# Patient Record
Sex: Female | Born: 1975 | Race: White | Hispanic: No | Marital: Married | Smoking: Former smoker
Health system: Southern US, Community
[De-identification: ages and names within clinical notes are randomized; demographics above are authoritative.]

## PROBLEM LIST (undated history)

## (undated) DIAGNOSIS — R636 Underweight: Secondary | ICD-10-CM

## (undated) HISTORY — DX: Underweight: R63.6

---

## 2014-01-05 LAB — HM PAP SMEAR: HM Pap smear: NEGATIVE

## 2014-12-15 ENCOUNTER — Emergency Department (HOSPITAL_COMMUNITY): Payer: BLUE CROSS/BLUE SHIELD

## 2014-12-15 ENCOUNTER — Encounter (HOSPITAL_COMMUNITY): Payer: Self-pay | Admitting: *Deleted

## 2014-12-15 ENCOUNTER — Inpatient Hospital Stay (HOSPITAL_COMMUNITY)
Admission: EM | Admit: 2014-12-15 | Discharge: 2014-12-19 | DRG: 387 | Disposition: A | Discharge status: Home / Self Care | Payer: BLUE CROSS/BLUE SHIELD | Attending: Infectious Diseases | Admitting: Infectious Diseases

## 2014-12-15 DIAGNOSIS — G4700 Insomnia, unspecified: Secondary | ICD-10-CM | POA: Diagnosis present

## 2014-12-15 DIAGNOSIS — F3281 Premenstrual dysphoric disorder: Secondary | ICD-10-CM | POA: Diagnosis present

## 2014-12-15 DIAGNOSIS — R197 Diarrhea, unspecified: Secondary | ICD-10-CM | POA: Diagnosis present

## 2014-12-15 DIAGNOSIS — F419 Anxiety disorder, unspecified: Secondary | ICD-10-CM | POA: Diagnosis present

## 2014-12-15 DIAGNOSIS — Z8719 Personal history of other diseases of the digestive system: Secondary | ICD-10-CM

## 2014-12-15 DIAGNOSIS — R1110 Vomiting, unspecified: Secondary | ICD-10-CM | POA: Insufficient documentation

## 2014-12-15 DIAGNOSIS — R112 Nausea with vomiting, unspecified: Secondary | ICD-10-CM

## 2014-12-15 DIAGNOSIS — K529 Noninfective gastroenteritis and colitis, unspecified: Secondary | ICD-10-CM

## 2014-12-15 DIAGNOSIS — E162 Hypoglycemia, unspecified: Secondary | ICD-10-CM | POA: Diagnosis not present

## 2014-12-15 DIAGNOSIS — K5100 Ulcerative (chronic) pancolitis without complications: Principal | ICD-10-CM | POA: Diagnosis present

## 2014-12-15 DIAGNOSIS — F17210 Nicotine dependence, cigarettes, uncomplicated: Secondary | ICD-10-CM | POA: Diagnosis present

## 2014-12-15 HISTORY — DX: Personal history of other diseases of the digestive system: Z87.19

## 2014-12-15 LAB — URINE MICROSCOPIC-ADD ON

## 2014-12-15 LAB — URINALYSIS, ROUTINE W REFLEX MICROSCOPIC
BILIRUBIN URINE: NEGATIVE
GLUCOSE, UA: NEGATIVE mg/dL
Ketones, ur: >80 mg/dL — AB
Leukocytes, UA: NEGATIVE
Nitrite: NEGATIVE
PROTEIN: NEGATIVE mg/dL
SPECIFIC GRAVITY, URINE: 1.012 (ref 1.005–1.030)
UROBILINOGEN UA: 0.2 mg/dL (ref 0.0–1.0)
pH: 6.5 (ref 5.0–8.0)

## 2014-12-15 LAB — CBC
HCT: 42.1 % (ref 36.0–46.0)
Hemoglobin: 14.2 g/dL (ref 12.0–15.0)
MCH: 30.1 pg (ref 26.0–34.0)
MCHC: 33.7 g/dL (ref 30.0–36.0)
MCV: 89.2 fL (ref 78.0–100.0)
Platelets: 176 10*3/uL (ref 150–400)
RBC: 4.72 MIL/uL (ref 3.87–5.11)
RDW: 13.3 % (ref 11.5–15.5)
WBC: 10.3 10*3/uL (ref 4.0–10.5)

## 2014-12-15 LAB — COMPREHENSIVE METABOLIC PANEL
ALBUMIN: 3.9 g/dL (ref 3.5–5.0)
ALK PHOS: 69 U/L (ref 38–126)
ALT: 20 U/L (ref 14–54)
AST: 25 U/L (ref 15–41)
Anion gap: 8 (ref 5–15)
BILIRUBIN TOTAL: 0.4 mg/dL (ref 0.3–1.2)
BUN: 9 mg/dL (ref 6–20)
CALCIUM: 9.1 mg/dL (ref 8.9–10.3)
CO2: 27 mmol/L (ref 22–32)
Chloride: 101 mmol/L (ref 101–111)
Creatinine, Ser: 0.78 mg/dL (ref 0.44–1.00)
GFR calc Af Amer: >60 mL/min (ref >60)
GFR calc non Af Amer: >60 mL/min (ref >60)
GLUCOSE: 76 mg/dL (ref 65–99)
Potassium: 3.9 mmol/L (ref 3.5–5.1)
Sodium: 136 mmol/L (ref 135–145)
TOTAL PROTEIN: 6.7 g/dL (ref 6.5–8.1)

## 2014-12-15 LAB — I-STAT BETA HCG BLOOD, ED (MC, WL, AP ONLY): I-stat hCG, quantitative: <5 m[IU]/mL (ref <5)

## 2014-12-15 LAB — LIPASE, BLOOD: Lipase: 20 U/L (ref 11–51)

## 2014-12-15 MED ORDER — IOHEXOL 300 MG/ML  SOLN
25.0000 mL | Freq: Once | INTRAMUSCULAR | Status: AC | PRN
  Administered 2014-12-15: 25 mL via ORAL

## 2014-12-15 MED ORDER — ONDANSETRON HCL 4 MG/2ML IJ SOLN
4.0000 mg | Freq: Four times a day (QID) | INTRAMUSCULAR | Status: DC | PRN
  Administered 2014-12-15 – 2014-12-18 (×6): 4 mg via INTRAVENOUS
  Filled 2014-12-15 (×6): qty 2

## 2014-12-15 MED ORDER — ALUM & MAG HYDROXIDE-SIMETH 200-200-20 MG/5ML PO SUSP
30.0000 mL | Freq: Four times a day (QID) | ORAL | Status: DC | PRN
  Administered 2014-12-15 – 2014-12-16 (×2): 30 mL via ORAL
  Filled 2014-12-15 (×2): qty 30

## 2014-12-15 MED ORDER — MORPHINE SULFATE (PF) 4 MG/ML IV SOLN
4.0000 mg | Freq: Once | INTRAVENOUS | Status: AC
  Administered 2014-12-15: 4 mg via INTRAVENOUS
  Filled 2014-12-15: qty 1

## 2014-12-15 MED ORDER — ACETAMINOPHEN 325 MG PO TABS
650.0000 mg | ORAL_TABLET | Freq: Four times a day (QID) | ORAL | Status: DC | PRN
  Administered 2014-12-15 – 2014-12-16 (×2): 650 mg via ORAL
  Filled 2014-12-15 (×2): qty 2

## 2014-12-15 MED ORDER — ENOXAPARIN SODIUM 40 MG/0.4ML ~~LOC~~ SOLN
40.0000 mg | SUBCUTANEOUS | Status: DC
  Filled 2014-12-15 (×3): qty 0.4

## 2014-12-15 MED ORDER — ONDANSETRON HCL 4 MG/2ML IJ SOLN
4.0000 mg | Freq: Once | INTRAMUSCULAR | Status: AC
  Administered 2014-12-15: 4 mg via INTRAVENOUS
  Filled 2014-12-15: qty 2

## 2014-12-15 MED ORDER — ONDANSETRON 4 MG PO TBDP
4.0000 mg | ORAL_TABLET | Freq: Once | ORAL | Status: AC | PRN
  Administered 2014-12-15: 4 mg via ORAL

## 2014-12-15 MED ORDER — TEMAZEPAM 7.5 MG PO CAPS
7.5000 mg | ORAL_CAPSULE | Freq: Every evening | ORAL | Status: DC | PRN
  Administered 2014-12-15: 7.5 mg via ORAL
  Filled 2014-12-15: qty 1

## 2014-12-15 MED ORDER — ONDANSETRON HCL 4 MG/2ML IJ SOLN: 4.0000 mg | Freq: Three times a day (TID) | INTRAMUSCULAR | Status: DC | PRN

## 2014-12-15 MED ORDER — SODIUM CHLORIDE 0.9 % IV SOLN
INTRAVENOUS | Status: AC
  Administered 2014-12-15: 21:00:00 via INTRAVENOUS

## 2014-12-15 MED ORDER — SODIUM CHLORIDE 0.9 % IV BOLUS (SEPSIS)
1000.0000 mL | Freq: Once | INTRAVENOUS | Status: AC
  Administered 2014-12-15: 1000 mL via INTRAVENOUS

## 2014-12-15 MED ORDER — ONDANSETRON 4 MG PO TBDP
ORAL_TABLET | ORAL | Status: AC
  Filled 2014-12-15: qty 1

## 2014-12-15 MED ORDER — IOHEXOL 300 MG/ML  SOLN
100.0000 mL | Freq: Once | INTRAMUSCULAR | Status: AC | PRN
  Administered 2014-12-15: 100 mL via INTRAVENOUS

## 2014-12-15 MED ORDER — KETOROLAC TROMETHAMINE 15 MG/ML IJ SOLN
15.0000 mg | Freq: Once | INTRAMUSCULAR | Status: AC
  Administered 2014-12-15: 15 mg via INTRAVENOUS
  Filled 2014-12-15: qty 1

## 2014-12-15 MED ORDER — CIPROFLOXACIN HCL 500 MG PO TABS
500.0000 mg | ORAL_TABLET | Freq: Two times a day (BID) | ORAL | Status: DC
  Administered 2014-12-15 – 2014-12-16 (×3): 500 mg via ORAL
  Filled 2014-12-15 (×4): qty 1

## 2014-12-15 MED ORDER — METRONIDAZOLE 500 MG PO TABS
500.0000 mg | ORAL_TABLET | Freq: Two times a day (BID) | ORAL | Status: DC
  Administered 2014-12-15 – 2014-12-16 (×3): 500 mg via ORAL
  Filled 2014-12-15 (×3): qty 1

## 2014-12-15 MED ORDER — ONDANSETRON HCL 4 MG PO TABS: 4.0000 mg | ORAL_TABLET | Freq: Four times a day (QID) | ORAL | Status: DC | PRN

## 2014-12-15 NOTE — ED Provider Notes (Signed)
This [facility] care [facility] at shift change. Please see his note for further. Patient presented with nausea, vomiting and diarrhea for the past 3 days with copious amounts of diarrhea. Patient tells me she is still having diarrhea in the emergency department and has had blood in her stool. In my evaluation the patient reports her abdominal pain is down to 2 out of 10 and her nausea is much improved. She is wanting to try water to drink.  The patient's CBC, CMP and lipase are within normal limits. She has a negative isotope hCG. Urinalysis indicated nitrite and leukocyte negative urine with many bacteria and urine microscopic exam. Urine was sent for culture. At shift change the patient is awaiting CT scan. CT scan indicated diffuse colonic wall thickening, most probably involving the hepatic flexure, suggesting infectious/inflammatory pancolitis. Patient has generalized abdominal tenderness to palpation without focal tenderness.  We will provide with second fluid bolus, Zofran and some morphine and do by mouth trial with water.   Patient was discussed with and evaluated by Dr. Rennis ChrisJacobowitz who feels the patient needs to be admitted. I agree with this plan. I consulted for admission with internal medicine team and was the patient was accepted for admission with attending physician Dr. Ninetta LightsHatcher. Med surg orders placed.   This patient was discussed with and evaluated by Dr. Rennis ChrisJacobowitz agrees with assessment and plan.  Everlene FarrierWilliam Faizah Kandler, PA-C 12/16/14 0104  Doug SouSam Jacubowitz, MD 12/16/14 [id number]

## 2014-12-15 NOTE — H&P (Signed)
Date: 12/15/2014               Patient Name:  Victoria Ashley MRN: [id number]  DOB: October 07, 1975 Age / Sex: 39 y.o., female   PCP: Lindley Magnus, PA-C         Medical Service: Internal Medicine Teaching Service         Attending Physician: Dr. Ginnie Smart, MD    First Contact: Dr. Gwynn Burly, DO Pager: [phone]  Second Contact: Dr. Rich Number, MD Pager: [phone]       After Hours (After 5p/  First Contact Pager: [phone]  weekends / holidays): Second Contact Pager: [phone]   Chief Complaint: Diarrhea  History of Present Illness:   Victoria Ashley is a 39 year old woman with no obviously contributory past medical history who presents with nausea, vomiting, and diarrhea. On the morning of 10/8, she had watery, occasionally bloody, diarrhea for about four hours that eventually resolved later in the day. Her symptoms returned on 10/9, this time associated with nausea and non-bloody, non-bilious vomiting. She has had very low appetite and has been unable to keep anything down. Victoria Ashley hasn't taken her temperature, but she has had chills and endorses some numbness and tingling in her extremities and presyncopal symptoms. She has had lower abdominal pain as well. She attempted taking Pepto-Bismol, but it was unhelpful. She reports going on a hike last week, but said that she did not drink any water. She denies any sick contacts, having children in daycare, recent antibiotic use, or travel. She reports that this is first time this has happened to her, cannot recall any family members with similar symptoms, and cannot identify any family members with autoimmune disorders. Otherwise, she denies any headache, chest pain, shortness of breath, polyuria, dysuria, vaginal discharge, or unintended weight loss. She was seen in at [facility] in [place] on 11/10 for these symptoms and was advised to go to the ED to assess for dehydration and electrolyte imbalances.  Her other medical  issues are insomnia for which she takes temazepam, anxiety for which she take fluoxetine (hasn't taken in 3 days), and premenstrual dysphoria for which she was planning to take progesterone (has not started yet). She also has a history of infertility and sought donor oocytes in the past. She is premenopausal and has not had a period in 8 months. She has a 5 pack-year history, currently smoking 1/4 ppd. Does not use drugs or alcohol. She is only sexually active with her husband. 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   In the ED, she was afebrile without a leukocytosis. There were no electrolyte abnormalities or evidence of an AKI. A UA did not suggest a UTI. A pregnancy test was negative. Lipase was normal. A CT of the abdomen and pelvis with contrast demonstrated diffuse colonic wall thickening suggestive of an infectious/inflammatory pancolitis with a small volume of pelvic ascites. A stool culture was obtained. She received 1L NS in the ED.  Meds: Current Facility-Administered Medications  Medication Dose Route Frequency Provider Last Rate Last Dose  . 0.9 %  sodium chloride infusion   Intravenous Continuous Ruben Im, MD      . alum & mag hydroxide-simeth (MAALOX/MYLANTA) 200-200-20 MG/5ML suspension 30 mL  30 mL Oral Q6H PRN Ruben Im, MD      . enoxaparin (LOVENOX) injection 40 mg  40 mg Subcutaneous Q24H Ruben Im, MD      . ondansetron [facility]) tablet 4 mg  4 mg Oral Q6H PRN Ruben Im, MD       Or  . ondansetron [facility]) injection 4 mg  4 mg Intravenous Q6H PRN Ruben Im, MD      . temazepam (RESTORIL) capsule 7.5-15 mg  7.5-15 mg Oral QHS PRN Ruben Im, MD        Allergies: Allergies as of 12/15/2014  . (No Known Allergies)   History reviewed. No pertinent past medical history. History reviewed. No pertinent past surgical history. No family history on file. Social History   Social History  . Marital Status: Married    Spouse Name: N/A  . Number of Children: N/A    . Years of Education: N/A   Occupational History  . Not on file.   Social History Main Topics  . Smoking status: Current Every Day Smoker -- 0.25 packs/day for 20 years    Types: Cigarettes  . Smokeless tobacco: Not on file  . Alcohol Use: No  . Drug Use: No  . Sexual Activity: Yes    Birth Control/ Protection: None   Other Topics Concern  . Not on file   Social History Narrative   Part time Auditor    Review of Systems: Negative except per HPI.  Physical Exam: Blood pressure 115/65, pulse 65, temperature 98.2 F (36.8 C), temperature source Oral, resp. rate 17, height  (1.626 m), weight 114 lb (51.71 kg), SpO2 96 %. General : Lying in bed, no acute distress HEENT: Moist mucous membranes, no tonsillar erythema or exudates, EOMI, PERRL Pulmonary: clear to auscultation bilaterally. Normal effort Cardiovascular: regular rate and rhythm, S1, S2 normal, no murmur,  rub or gallop Abdomen: Mildly hyperactive bowel sounds. Tenderness to palpation in the lower quadrants. No masses palpated or rebound.  Extremities: No cyanosis or edema. 2+ DP pulses Skin: Skin color and turgor normal. No rashes noted. Warm and dry Neurological: AAOx3, tongue midline, face symmetric  Lab results: Basic Metabolic Panel:  Recent Labs  45/40/98 1305  NA 136  K 3.9  CL 101  CO2 27  GLUCOSE 76  BUN 9  CREATININE 0.78  CALCIUM 9.1   Liver Function Tests:  Recent Labs  12/15/14 1305  AST 25  ALT 20  ALKPHOS 69  BILITOT 0.4  PROT 6.7  ALBUMIN 3.9    Recent Labs  12/15/14 1305  LIPASE 20   CBC:  Recent Labs  12/15/14 1305  WBC 10.3  HGB 14.2  HCT 42.1  MCV 89.2  PLT 176   Urinalysis:  Recent Labs  12/15/14 1557  COLORURINE YELLOW  LABSPEC 1.012  PHURINE 6.5  GLUCOSEU NEGATIVE  HGBUR SMALL*  BILIRUBINUR NEGATIVE  KETONESUR >80*  PROTEINUR NEGATIVE  UROBILINOGEN 0.2  NITRITE NEGATIVE  LEUKOCYTESUR NEGATIVE   Imaging results:  Ct Abdomen Pelvis W  Contrast  12/15/2014  CLINICAL DATA:  Lower abdominal pain, nausea/vomiting x2 days EXAM: CT ABDOMEN AND PELVIS WITH CONTRAST TECHNIQUE: Multidetector CT imaging of the abdomen and pelvis was performed using the standard protocol following bolus administration of intravenous contrast. CONTRAST:  OMNIPAQUE IOHEXOL 300 MG/ML  SOLN COMPARISON:  None. FINDINGS: Lower chest:  Lung bases are clear. Hepatobiliary: Liver is within normal limits. No suspicious/enhancing hepatic lesions. Gallbladder is unremarkable. No intrahepatic or extrahepatic ductal dilatation. Pancreas: Within normal limits. Spleen: Calcified splenic granulomata. Adrenals/Urinary Tract: Adrenal glands are within normal limits. Kidneys are within normal limits.  No hydronephrosis. Bladder is within normal limits. Stomach/Bowel: Stomach is within normal limits. No evidence of bowel  obstruction. Normal appendix (series 2/ image 64). Diffuse colonic wall thickening, most prominently involving the right colon near the hepatic flexure (series 2/ image 45), suggesting infectious/ inflammatory colitis. Vascular/Lymphatic: No evidence of abdominal aortic aneurysm. No suspicious abdominopelvic lymphadenopathy. Reproductive: Uterus is within normal limits. Bilateral ovaries are within normal limits. Other: Small volume pelvic ascites. No drainable fluid collection/abscess. No free air. Musculoskeletal: Visualized osseous structures are within normal limits. IMPRESSION: Diffuse colonic wall thickening, most prominently involving the hepatic flexure, suggesting infectious/inflammatory pancolitis. No drainable fluid collection/abscess.  No free air. Small volume pelvic ascites. Electronically Signed   By: Charline BillsSriyesh  Krishnan M.D.   On: 12/15/2014 17:29    Assessment & Plan by Problem:  Pancolitis: Differential includes infectious etiology or IBD. However, she is afebrile without a leukocytosis, no sick contacts or recent travel, although she did recently  go hiking. Could be viral gastroenteritis, although bloody diarrhea is more suggestive of a bacterial etiology. No recent antibiotic use. No family history of autoimmune disorder, IBD or otherwise. If her symptoms do not resolve on antibiotics by next week, a colonoscopy or sigmoidoscopy (based on symptoms) with biopsy to query IBD would be appropriate. - Ciprofloxacin 500 mg BID, Metronidazole 500 mg BID for up to an 8 week course if stool culture suggestive of bacterial etiology - Stool culture pending - IV NS 100 cc/hr x 12 hrs - Zofran prn nausea, Mylanta prn - Tylenol prn abdominal pain - NPO but advance as tolerated  Anxiety: Holding fluoxetine. Has not taken in 3 days.  Premenstrual dysphoria disorder: Holding progesterone. Has not taken yet.  Insomnia: Continue home temazepam 7.5 - 15 mg nightly.  DVT Prophylaxis: Lovenox [place]  Dispo: Disposition is deferred at this time, awaiting improvement of current medical problems. Anticipated discharge in approximately 2-3 day(s).   The patient does have a current PCP Morrie Sheldon(Ashley Long, PA-C) and does not [facility] after discharge.  The patient does not have transportation limitations that hinder transportation to clinic appointments.  Signed: Ruben ImJeremy Julianne Chamberlin, MD 12/15/2014, 8:19 PM

## 2014-12-15 NOTE — ED Provider Notes (Signed)
CSN: [id number]     Arrival date & time 12/15/14  1232 History   First MD Initiated Contact with Patient 12/15/14 1329     Chief Complaint  Patient presents with  . Emesis   HPI   39 year old female presents today with complaints of diarrhea, nausea, vomiting 3 days. Patient reports symptoms started as diarrhea, reports copious episodes of diarrhea over the last 3 days with associated vomiting yesterday. She reports approximate 5 episodes of non-bloody vomit. Patient reports that she's been able to tolerate small amounts of by mouth liquids, but with continuation of symptoms one further evaluation. She sought care at urgent care and was unable to perform labs or imaging, she was directed to the emergency room for CT scan to rule out appendicitis and IV hydration. I wish and patient reports that she is nauseous, having "cramping" throughout her abdomen diffusely. She denies fever, chills, chest pain or shortness of breath, vaginal discharge or bleeding. She denies any changes in the color clarity or characteristics of her urine. She denies any close sick contacts, no one in the house experiencing similar symptoms, no exposure to abnormal food or drink, freshwater. No recent antibiotic exposure.    History reviewed. No pertinent past medical history. History reviewed. No pertinent past surgical history. No family history on file. Social History  Substance Use Topics  . Smoking status: Current Every Day Smoker -- 0.25 packs/day    Types: Cigarettes  . Smokeless tobacco: None  . Alcohol Use: No   OB History    No data available     Review of Systems  All other systems reviewed and are negative.  Allergies  Review of patient's allergies indicates no known allergies.  Home Medications   Prior to Admission medications   Medication Sig Start Date End Date Taking? Authorizing Provider  cholecalciferol (VITAMIN D) 1000 UNITS tablet Take 1,000 Units by mouth daily.   Yes Historical  Provider, MD  FLUoxetine (PROZAC) 10 MG capsule Take 10 mg by mouth daily. 11/29/14  Yes Historical Provider, MD  Multiple Vitamins-Minerals (AIRBORNE PO) Take 1 capsule by mouth daily as needed (prevention).   Yes Historical Provider, MD  progesterone (PROMETRIUM) 200 MG capsule Take 200 mg by mouth daily. 12/12/14  Yes Historical Provider, MD  temazepam (RESTORIL) 7.5 MG capsule Take 7.5-15 mg by mouth at bedtime as needed. sleep 11/18/14  Yes Historical Provider, MD  vitamin B-12 (CYANOCOBALAMIN) 1000 MCG tablet Take 1,000 mcg by mouth daily.   Yes Historical Provider, MD   BP 106/73 mmHg  Pulse 71  Temp(Src) 98.1 F (36.7 C) (Oral)  Resp 19  Ht 5\' 4"  (1.626 m)  Wt 114 lb (51.71 kg)  BMI 19.56 kg/m2  SpO2 97%   Physical Exam  Constitutional: She is oriented to person, place, and time. She appears well-developed and well-nourished.  HENT:  Head: Normocephalic and atraumatic.  Eyes: Conjunctivae are normal. Pupils are equal, round, and reactive to light. Right eye exhibits no discharge. Left eye exhibits no discharge. No scleral icterus.  Neck: Normal range of motion. No JVD present. No tracheal deviation present.  Pulmonary/Chest: Effort normal and breath sounds normal. No stridor. No respiratory distress. She has no wheezes. She has no rales. She exhibits no tenderness.  Abdominal: She exhibits no distension and no mass. There is tenderness. There is no rebound and no guarding.  Abdomen diffusely tender to palpation, concentrated in the right lower left lower quadrants, no rebound, guarding, masses noted  Musculoskeletal: Normal range of  motion. She exhibits no edema or tenderness.  Neurological: She is alert and oriented to person, place, and time. Coordination normal.  Skin: Skin is warm and dry.  Psychiatric: She has a normal mood and affect. Her behavior is normal. Judgment and thought content normal.  Nursing note and vitals reviewed.    ED Course  Procedures (including  critical care time) Labs Review Labs Reviewed  LIPASE, BLOOD  COMPREHENSIVE METABOLIC PANEL  CBC  URINALYSIS, ROUTINE W REFLEX MICROSCOPIC (NOT AT [facility])  I-STAT BETA HCG BLOOD, ED ([facility], [facility], [facility] ONLY)    Imaging Review No results found. I have personally reviewed and evaluated these images and lab results as part of my medical decision-making.   EKG Interpretation None      MDM   Final diagnoses:  Diarrhea, unspecified type  Non-intractable vomiting with nausea, vomiting of unspecified type    Labs: Urinalysis, i-STAT beta-hCG, lipase, CMP, CBC- labs unremarkable still pending urinalysis  Imaging: CT abdomen and pelvis  Consults:  Therapeutics: Zofran, Toradol, normal saline  Discharge Meds:   Assessment/Plan: Patient presents with nausea vomiting diarrhea. This most likely represents a viral gastroenteritis, her labs are unremarkable, vital signs within normal limits and stable, patient is nontoxic appearing. She was treated with Toradol here in the ED for abdominal discomfort which significantly improved the discomfort. Due to patient's concern for appendicitis and recommendation of previous provider CT scan was ordered. Low suspicion for appendicitis or significant intra-abdominal pathology. Patient will likely be discharged home if no significant findings are found on the CT abdomen and pelvis. Patient's care signed out to oncoming provider Will Dansie PA-C pending CT results and final disposition.         Eyvonne Mechanic, PA-C 12/15/14 1643  Marily Memos, MD 12/16/14 [phone]

## 2014-12-15 NOTE — ED Provider Notes (Signed)
Planes of vomiting a few's abdominal pain and diarrhea onset 3 days ago. Patient continues to complain of nausea and she reports presently 10 episodes of diarrhea today. On exam alert appears moderately ill-appearing HEENT exam mucous memory and dry abdomen hyperactive bowel sounds diffusely tender no guarding rigidity or rebound  Doug SouSam Sacha Topor, MD 12/15/14 1841

## 2014-12-15 NOTE — ED Notes (Signed)
Pt in from UC for eval, pt reports onset diarrhea onset x 3 days, pt reports vomiting onset yesterday, vomiting episodes x 5 today, x15-20 episodes of diarrhea, pt sent from UC to r/o appendicitis & for IV hydration per pt & family, pt c/o bil abd pain, pt A&O x4

## 2014-12-16 DIAGNOSIS — K5100 Ulcerative (chronic) pancolitis without complications: Secondary | ICD-10-CM | POA: Diagnosis not present

## 2014-12-16 DIAGNOSIS — F419 Anxiety disorder, unspecified: Secondary | ICD-10-CM | POA: Diagnosis not present

## 2014-12-16 DIAGNOSIS — F3281 Premenstrual dysphoric disorder: Secondary | ICD-10-CM

## 2014-12-16 DIAGNOSIS — G4700 Insomnia, unspecified: Secondary | ICD-10-CM | POA: Diagnosis not present

## 2014-12-16 LAB — BASIC METABOLIC PANEL
Anion gap: 15 (ref 5–15)
BUN: 8 mg/dL (ref 6–20)
CHLORIDE: 104 mmol/L (ref 101–111)
CO2: 20 mmol/L — AB (ref 22–32)
Calcium: 8.6 mg/dL — ABNORMAL LOW (ref 8.9–10.3)
Creatinine, Ser: 0.75 mg/dL (ref 0.44–1.00)
GFR calc non Af Amer: >60 mL/min (ref >60)
Glucose, Bld: 43 mg/dL — CL (ref 65–99)
POTASSIUM: 3.3 mmol/L — AB (ref 3.5–5.1)
SODIUM: 139 mmol/L (ref 135–145)

## 2014-12-16 LAB — GLUCOSE, CAPILLARY
GLUCOSE-CAPILLARY: 113 mg/dL — AB (ref 65–99)
GLUCOSE-CAPILLARY: 51 mg/dL — AB (ref 65–99)

## 2014-12-16 LAB — URINE CULTURE: CULTURE: NO GROWTH

## 2014-12-16 LAB — HIV ANTIBODY (ROUTINE TESTING W REFLEX): HIV SCREEN 4TH GENERATION: NONREACTIVE

## 2014-12-16 LAB — C DIFFICILE QUICK SCREEN W PCR REFLEX
C DIFFICILE (CDIFF) INTERP: NEGATIVE
C DIFFICILE (CDIFF) TOXIN: NEGATIVE
C Diff antigen: NEGATIVE

## 2014-12-16 MED ORDER — TRAMADOL-ACETAMINOPHEN 37.5-325 MG PO TABS
1.0000 | ORAL_TABLET | Freq: Four times a day (QID) | ORAL | Status: DC | PRN
  Administered 2014-12-16 (×2): 1 via ORAL
  Filled 2014-12-16 (×3): qty 1

## 2014-12-16 MED ORDER — MORPHINE SULFATE (PF) 2 MG/ML IV SOLN
1.0000 mg | INTRAVENOUS | Status: DC | PRN
  Administered 2014-12-16 – 2014-12-17 (×4): 1 mg via INTRAVENOUS
  Filled 2014-12-16 (×4): qty 1

## 2014-12-16 MED ORDER — KETOROLAC TROMETHAMINE 15 MG/ML IJ SOLN
15.0000 mg | Freq: Three times a day (TID) | INTRAMUSCULAR | Status: DC
  Administered 2014-12-16: 15 mg via INTRAVENOUS
  Filled 2014-12-16: qty 1

## 2014-12-16 MED ORDER — KCL IN DEXTROSE-NACL 40-5-0.45 MEQ/L-%-% IV SOLN
INTRAVENOUS | Status: AC
  Administered 2014-12-16 (×2): via INTRAVENOUS
  Filled 2014-12-16 (×2): qty 1000

## 2014-12-16 MED ORDER — PROMETHAZINE HCL 25 MG/ML IJ SOLN
25.0000 mg | Freq: Once | INTRAMUSCULAR | Status: AC
  Administered 2014-12-17: 25 mg via INTRAVENOUS
  Filled 2014-12-16: qty 1

## 2014-12-16 NOTE — Progress Notes (Signed)
Subjective: [name] described diffuse, crampy abdominal pain when we saw her this AM on rounds and had just had a small-volume bowel movement before we entered. She endorses nausea but has not thrown up. Her stools continue to be bloody. She states that she "always gets hypoglycemic" when she's sick, such as with a cold, although she has never been sick like this. In the afternoon, she stated she had much less frequent, non-bloody diarrhea, and the morphine is controlling her abdominal pain well.  Objective: Vital signs in last 24 hours: Filed Vitals:   12/15/14 1800 12/15/14 1900 12/15/14 2003 12/16/14 0559  BP: 110/70 113/67 115/65 118/69  Pulse: 71 72 65 80  Temp:   98.2 F (36.8 C) 98.3 F (36.8 C)  TempSrc:   Oral Oral  Resp: 17 17 17 17   Height:      Weight:      SpO2: 96% 95% 96% 100%   General: Somewhat anxious-appearing woman, walking to bed with her back bent forward, holding stomach HEENT: Pupils equal and reactive to light. MMM. No conjunctival pallor CV: regular rate and rhythm, no murmurs appreciated Pulm: No increased work of breathing, CTAB Abd: Borborygmi present, diffuse mild tenderness to palpation. Non-distended. No HSM or masses appreciated Extremities: Warm, well-perfused, no cyanosis or edema. Pulses 2+ bilaterally Neuro: A&O X 3   Intake/Output Summary (Last 24 hours) at 12/16/14 1340 Last data filed at 12/16/14 1015  Gross per 24 hour  Intake    240 ml  Output    650 ml  Net   -410 ml    CBC: 10.3 / 14.2 / 42.1 / 176 BMP: 139 / 3.3 / 104 / 20 / 43 / 8 / .75  Glucose: 43-->51-->113 (724a)  C. Diff antigen, toxin negative Urine Cx no growth @ 1 day CT Abdomen showing diffuse colonic wall thickening, most prominent at the hepatic flexure, suggesting infectious/inflammatory pancolitis. Small volume pelvic ascites  Assessment/Plan: 1. Pancolitis: Improving when I saw her this afternoon, stools no longer with frank blood. She remains afebrile with no  leukocytosis. Time course best fits infectious etiology, stool pathogen panel  and culture pending. C. Diff negative (bloody stools less characteristic). Pt denies weight loss or every having any similar symptoms; unlikely to have acute presentation of IBD. Viral gastroenteritis possible but less likely given blood. Morphine helping with abdominal pain and diarrheal frequency, but will not shorten duration of sx if bacterial.  -   Continue Cipro and Flagyl -   Zofran prn  -   Morphine prn for severe pain -   Ultracet prn for moderate pain -   Stool pathogen panel, culture pending -   C diff neg -   Replete electrolytes as needed -   D 5 1/2 NS w 40 mEq KCl X 10 h -   Advance diet as tolerated  2. Hypoglycemia: Resolved with juice. Has been stable since, monitoring via BG q 4h. -   Repeat BMP tmrw AM  3.  Anxiety -   Hold home Fluoxetine, hasn't taken in 3 days  4. Premenstrual dysphoria disorder:  -   Hold home progesterone for now pending better oral intake  5.  Insomnia:  -   Continue home temazepam 7.5 - 15 mg nightly.  Diet: Clears, advance as tolerated Ppx: Lovenox  Dispo: Deferred pending resolution of current medical problems   This is a Psychologist, occupationalMedical Student Note.  The care of the patient was discussed with Dr. Earlene PlaterWallace, and the assessment  and plan formulated with their assistance.  Please see their attached note for official documentation of the daily encounter.     Bertha Stakes, Med Student 12/16/2014, 1:40 PM

## 2014-12-16 NOTE — Progress Notes (Signed)
Blood glucose this am was 43, gave orange juice blood sugar was 113, paged MD to inform

## 2014-12-16 NOTE — Progress Notes (Signed)
Subjective: Patient seen and examined this morning on rounds.  Glucose this morning or 43, improved with orange juice.  Still having abdominal pain with nausea.  No vomiting.  Objective: Vital signs in last 24 hours: Filed Vitals:   12/15/14 1800 12/15/14 1900 12/15/14 2003 12/16/14 0559  BP: 110/70 113/67 115/65 118/69  Pulse: 71 72 65 80  Temp:   98.2 F (36.8 C) 98.3 F (36.8 C)  TempSrc:   Oral Oral  Resp: Height:      Weight:      SpO2: 96% 95% 96% 100%   Weight change:   Intake/Output Summary (Last 24 hours) at 12/16/14 1327 Last data filed at 12/16/14 1015  Gross per 24 hour  Intake    240 ml  Output    650 ml  Net   -410 ml   General: resting in bed, no acute distress, uncomfortable appearing HEENT: EOMI, no scleral icterus Cardiac: RRR, no rubs, murmurs or gallops Pulm: clear to auscultation bilaterally, moving normal volumes of air Abd: soft, BS +, mildly tender to palpation  Ext: warm and well perfused, no pedal edema Neuro: alert and oriented X3, cranial nerves II-XII grossly intact  Lab Results: Basic Metabolic Panel:  Recent Labs Lab 12/15/14 1305 12/16/14 0510  NA 136 139  K 3.9 3.3*  CL 101 104  CO2 27 20*  GLUCOSE 76 43*  BUN 9 8  CREATININE 0.78 0.75  CALCIUM 9.1 8.6*   Liver Function Tests:  Recent Labs Lab 12/15/14 1305  AST 25  ALT 20  ALKPHOS 69  BILITOT 0.4  PROT 6.7  ALBUMIN 3.9    Recent Labs Lab 12/15/14 1305  LIPASE 20   No results for input(s): AMMONIA in the last 168 hours. CBC:  Recent Labs Lab 12/15/14 1305  WBC 10.3  HGB 14.2  HCT 42.1  MCV 89.2  PLT 176   Cardiac Enzymes: No results for input(s): CKTOTAL, CKMB, CKMBINDEX, TROPONINI in the last 168 hours. BNP: No results for input(s): PROBNP in the last 168 hours. D-Dimer: No results for input(s): DDIMER in the last 168 hours. CBG:  Recent Labs Lab 12/16/14 0650 12/16/14 0724  GLUCAP 51* 113*   Hemoglobin A1C: No results  for input(s): HGBA1C in the last 168 hours. Fasting Lipid Panel: No results for input(s): CHOL, HDL, LDLCALC, TRIG, CHOLHDL, LDLDIRECT in the last 168 hours. Thyroid Function Tests: No results for input(s): TSH, T4TOTAL, FREET4, T3FREE, THYROIDAB in the last 168 hours. Coagulation: No results for input(s): LABPROT, INR in the last 168 hours. Anemia Panel: No results for input(s): VITAMINB12, FOLATE, FERRITIN, TIBC, IRON, RETICCTPCT in the last 168 hours. Urine Drug Screen: Drugs of Abuse  No results found for: LABOPIA, COCAINSCRNUR, LABBENZ, AMPHETMU, THCU, LABBARB  Alcohol Level: No results for input(s): ETH in the last 168 hours. Urinalysis:  Recent Labs Lab 12/15/14 1557  COLORURINE YELLOW  LABSPEC 1.012  PHURINE 6.5  GLUCOSEU NEGATIVE  HGBUR SMALL*  BILIRUBINUR NEGATIVE  KETONESUR >80*  PROTEINUR NEGATIVE  UROBILINOGEN 0.2  NITRITE NEGATIVE  LEUKOCYTESUR NEGATIVE    Micro Results: Recent Results (from the past 240 hour(s))  Urine culture     Status: None   Collection Time: 12/15/14  3:57 PM  Result Value Ref Range Status   Specimen Description URINE, RANDOM  Final   Special Requests NONE  Final   Culture NO GROWTH 1 DAY  Final   Report Status 12/16/2014 FINAL  Final  C difficile  quick scan w PCR reflex     Status: None   Collection Time: 12/16/14  9:08 AM  Result Value Ref Range Status   C Diff antigen NEGATIVE NEGATIVE Final   C Diff toxin NEGATIVE NEGATIVE Final   C Diff interpretation Negative for toxigenic C. difficile  Final   Studies/Results: Ct Abdomen Pelvis W Contrast  12/15/2014  CLINICAL DATA:  Lower abdominal pain, nausea/vomiting x2 days EXAM: CT ABDOMEN AND PELVIS WITH CONTRAST TECHNIQUE: Multidetector CT imaging of the abdomen and pelvis was performed using the standard protocol following bolus administration of intravenous contrast. CONTRAST:  100mL OMNIPAQUE IOHEXOL 300 MG/ML  SOLN COMPARISON:  None. FINDINGS: Lower chest:  Lung bases are clear.  Hepatobiliary: Liver is within normal limits. No suspicious/enhancing hepatic lesions. Gallbladder is unremarkable. No intrahepatic or extrahepatic ductal dilatation. Pancreas: Within normal limits. Spleen: Calcified splenic granulomata. Adrenals/Urinary Tract: Adrenal glands are within normal limits. Kidneys are within normal limits.  No hydronephrosis. Bladder is within normal limits. Stomach/Bowel: Stomach is within normal limits. No evidence of bowel obstruction. Normal appendix (series 2/ image 64). Diffuse colonic wall thickening, most prominently involving the right colon near the hepatic flexure (series 2/ image 45), suggesting infectious/ inflammatory colitis. Vascular/Lymphatic: No evidence of abdominal aortic aneurysm. No suspicious abdominopelvic lymphadenopathy. Reproductive: Uterus is within normal limits. Bilateral ovaries are within normal limits. Other: Small volume pelvic ascites. No drainable fluid collection/abscess. No free air. Musculoskeletal: Visualized osseous structures are within normal limits. IMPRESSION: Diffuse colonic wall thickening, most prominently involving the hepatic flexure, suggesting infectious/inflammatory pancolitis. No drainable fluid collection/abscess.  No free air. Small volume pelvic ascites. Electronically Signed   By: Charline BillsSriyesh  Krishnan M.D.   On: 12/15/2014 17:29   Medications:  Scheduled Meds: . ciprofloxacin  500 mg Oral BID  . enoxaparin (LOVENOX) injection  40 mg Subcutaneous Q24H  . metroNIDAZOLE  500 mg Oral BID   Continuous Infusions:  PRN Meds:.alum & mag hydroxide-simeth, morphine injection, ondansetron **OR** ondansetron (ZOFRAN) IV, temazepam, traMADol-acetaminophen Assessment/Plan: Principal Problem:   Pancolitis (HCC) Active Problems:   Diarrhea   Emesis  Pancolitis: Continue to monitor for improvement on abx.  Will consider GI consult if lack of improvement for IBD query - Ciprofloxacin 500 mg BID, Metronidazole 500 mg BID (start date  11/10) - Stool culture pending - GI pathogen panel pending - C. Diff negative - D5-1/2 NS w/ 40mEq KCl x 10 hours for K 3.3 - Zofran prn nausea, Mylanta prn - Morphine 1mg  prn, Ultracet prn - Tylenol prn abdominal pain - Advance diet as tolerated  Anxiety: Holding fluoxetine. Has not taken in 3 days.  Premenstrual dysphoria disorder: Holding progesterone. Has not taken yet.  Insomnia: Continue home temazepam 7.5 - 15 mg nightly.  DVT Prophylaxis: Lovenox   Dispo: Disposition is deferred at this time, awaiting improvement of current medical problems.    The patient does have a current PCP Morrie Sheldon(Ashley Long, PA-C) and does not [facility] follow-up appointment after discharge.  The patient does not have transportation limitations that hinder transportation to clinic appointments.     Gwynn BurlyAndrew Laythan Hayter, DO 12/16/2014, 1:27 PM

## 2014-12-17 DIAGNOSIS — G4700 Insomnia, unspecified: Secondary | ICD-10-CM | POA: Diagnosis present

## 2014-12-17 DIAGNOSIS — R112 Nausea with vomiting, unspecified: Secondary | ICD-10-CM | POA: Diagnosis present

## 2014-12-17 DIAGNOSIS — K5100 Ulcerative (chronic) pancolitis without complications: Secondary | ICD-10-CM | POA: Diagnosis present

## 2014-12-17 DIAGNOSIS — E162 Hypoglycemia, unspecified: Secondary | ICD-10-CM | POA: Diagnosis not present

## 2014-12-17 DIAGNOSIS — F3281 Premenstrual dysphoric disorder: Secondary | ICD-10-CM | POA: Diagnosis present

## 2014-12-17 DIAGNOSIS — F419 Anxiety disorder, unspecified: Secondary | ICD-10-CM | POA: Diagnosis present

## 2014-12-17 DIAGNOSIS — F17210 Nicotine dependence, cigarettes, uncomplicated: Secondary | ICD-10-CM | POA: Diagnosis present

## 2014-12-17 LAB — CBC WITH DIFFERENTIAL/PLATELET
BASOS ABS: 0 10*3/uL (ref 0.0–0.1)
Basophils Relative: 0 %
EOS PCT: 3 %
Eosinophils Absolute: 0.3 10*3/uL (ref 0.0–0.7)
HCT: 37.1 % (ref 36.0–46.0)
HEMOGLOBIN: 12 g/dL (ref 12.0–15.0)
LYMPHS ABS: 1.6 10*3/uL (ref 0.7–4.0)
LYMPHS PCT: 21 %
MCH: 29.1 pg (ref 26.0–34.0)
MCHC: 32.3 g/dL (ref 30.0–36.0)
MCV: 90 fL (ref 78.0–100.0)
Monocytes Absolute: 1 10*3/uL (ref 0.1–1.0)
Monocytes Relative: 13 %
NEUTROS PCT: 63 %
Neutro Abs: 4.7 10*3/uL (ref 1.7–7.7)
PLATELETS: 150 10*3/uL (ref 150–400)
RBC: 4.12 MIL/uL (ref 3.87–5.11)
RDW: 13.5 % (ref 11.5–15.5)
WBC: 7.5 10*3/uL (ref 4.0–10.5)

## 2014-12-17 LAB — BASIC METABOLIC PANEL
ANION GAP: 6 (ref 5–15)
BUN: <5 mg/dL — ABNORMAL LOW (ref 6–20)
CHLORIDE: 106 mmol/L (ref 101–111)
CO2: 28 mmol/L (ref 22–32)
Calcium: 8.7 mg/dL — ABNORMAL LOW (ref 8.9–10.3)
Creatinine, Ser: 0.71 mg/dL (ref 0.44–1.00)
Glucose, Bld: 74 mg/dL (ref 65–99)
POTASSIUM: 4.2 mmol/L (ref 3.5–5.1)
SODIUM: 140 mmol/L (ref 135–145)

## 2014-12-17 MED ORDER — CIPROFLOXACIN IN D5W 400 MG/200ML IV SOLN
400.0000 mg | Freq: Two times a day (BID) | INTRAVENOUS | Status: AC
  Administered 2014-12-17: 400 mg via INTRAVENOUS
  Filled 2014-12-17: qty 200

## 2014-12-17 MED ORDER — DEXTROSE-NACL 5-0.45 % IV SOLN
INTRAVENOUS | Status: AC
Start: 2014-12-17 — End: 2014-12-17
  Administered 2014-12-17: 09:00:00 via INTRAVENOUS

## 2014-12-17 MED ORDER — TRAMADOL-ACETAMINOPHEN 37.5-325 MG PO TABS
1.0000 | ORAL_TABLET | Freq: Four times a day (QID) | ORAL | Status: DC | PRN
  Administered 2014-12-17 (×3): 2 via ORAL
  Administered 2014-12-18: 1 via ORAL
  Filled 2014-12-17 (×3): qty 2
  Filled 2014-12-17: qty 1

## 2014-12-17 MED ORDER — METRONIDAZOLE IN NACL 5-0.79 MG/ML-% IV SOLN
500.0000 mg | Freq: Three times a day (TID) | INTRAVENOUS | Status: DC
  Administered 2014-12-17 – 2014-12-18 (×5): 500 mg via INTRAVENOUS
  Filled 2014-12-17 (×6): qty 100

## 2014-12-17 NOTE — Progress Notes (Signed)
Subjective: Patient reports having 3 episodes of diarrhea overnight and one "accident." She also notes she became nauseous and had one episode of non-bilious vomiting. She reports not eating much and thinks the oral antibiotics are "rough" on her stomach. She reports her abdominal pain is currently a 2-3/10. Pain medication is helping. We discussed that we will stop morphine today and try using the Tramadol to control her pain.   Objective: Vital signs in last 24 hours: Filed Vitals:   12/16/14 0559 12/16/14 1352 12/16/14 2217 12/17/14 0540  BP: 118/69 121/76 133/77 106/66  Pulse: 80 59 59 63  Temp: 98.3 F (36.8 C) 98.3 F (36.8 C) 98.6 F (37 C) 98.2 F (36.8 C)  TempSrc: Oral Oral Oral Oral  Resp: Height:      Weight:      SpO2: 100% 100% 97% 97%   Weight change:   Intake/Output Summary (Last 24 hours) at 12/17/14 0724 Last data filed at 12/16/14 1758  Gross per 24 hour  Intake   1640 ml  Output   1050 ml  Net    590 ml   General: young woman resting in bed, NAD, uncomfortable appearing HEENT: [place]/AT, EOMI, no scleral icterus, mucus membranes dry CV: RRR, no m/g/r Pulm: CTA bilaterally  Abd: BS+, soft, mildly tender to palpation  Ext: warm and well perfused, no pedal edema Neuro: alert and oriented X 3, no focal deficits   Lab Results: Basic Metabolic Panel:  Recent Labs Lab 12/15/14 1305 12/16/14 0510  NA 136 139  K 3.9 3.3*  CL 101 104  CO2 27 20*  GLUCOSE 76 43*  BUN 9 8  CREATININE 0.78 0.75  CALCIUM 9.1 8.6*   Liver Function Tests:  Recent Labs Lab 12/15/14 1305  AST 25  ALT 20  ALKPHOS 69  BILITOT 0.4  PROT 6.7  ALBUMIN 3.9    Recent Labs Lab 12/15/14 1305  LIPASE 20   CBC:  Recent Labs Lab 12/15/14 1305  WBC 10.3  HGB 14.2  HCT 42.1  MCV 89.2  PLT 176   CBG:  Recent Labs Lab 12/16/14 0650 12/16/14 0724  GLUCAP 51* 113*   Urinalysis:  Recent Labs Lab 12/15/14 1557  COLORURINE YELLOW  LABSPEC 1.012   PHURINE 6.5  GLUCOSEU NEGATIVE  HGBUR SMALL*  BILIRUBINUR NEGATIVE  KETONESUR >80*  PROTEINUR NEGATIVE  UROBILINOGEN 0.2  NITRITE NEGATIVE  LEUKOCYTESUR NEGATIVE    Micro Results: Recent Results (from the past 240 hour(s))  Urine culture     Status: None   Collection Time: 12/15/14  3:57 PM  Result Value Ref Range Status   Specimen Description URINE, RANDOM  Final   Special Requests NONE  Final   Culture NO GROWTH 1 DAY  Final   Report Status 12/16/2014 FINAL  Final  C difficile quick scan w PCR reflex     Status: None   Collection Time: 12/16/14  9:08 AM  Result Value Ref Range Status   C Diff antigen NEGATIVE NEGATIVE Final   C Diff toxin NEGATIVE NEGATIVE Final   C Diff interpretation Negative for toxigenic C. difficile  Final   Studies/Results: Ct Abdomen Pelvis W Contrast  12/15/2014  CLINICAL DATA:  Lower abdominal pain, nausea/vomiting x2 days EXAM: CT ABDOMEN AND PELVIS WITH CONTRAST TECHNIQUE: Multidetector CT imaging of the abdomen and pelvis was performed using the standard protocol following bolus administration of intravenous contrast. CONTRAST:  OMNIPAQUE IOHEXOL 300 MG/ML  SOLN COMPARISON:  None. FINDINGS: Lower chest:  Lung bases are clear. Hepatobiliary: Liver is within normal limits. No suspicious/enhancing hepatic lesions. Gallbladder is unremarkable. No intrahepatic or extrahepatic ductal dilatation. Pancreas: Within normal limits. Spleen: Calcified splenic granulomata. Adrenals/Urinary Tract: Adrenal glands are within normal limits. Kidneys are within normal limits.  No hydronephrosis. Bladder is within normal limits. Stomach/Bowel: Stomach is within normal limits. No evidence of bowel obstruction. Normal appendix (series 2/ image 64). Diffuse colonic wall thickening, most prominently involving the right colon near the hepatic flexure (series 2/ image 45), suggesting infectious/ inflammatory colitis. Vascular/Lymphatic: No evidence of abdominal aortic  aneurysm. No suspicious abdominopelvic lymphadenopathy. Reproductive: Uterus is within normal limits. Bilateral ovaries are within normal limits. Other: Small volume pelvic ascites. No drainable fluid collection/abscess. No free air. Musculoskeletal: Visualized osseous structures are within normal limits. IMPRESSION: Diffuse colonic wall thickening, most prominently involving the hepatic flexure, suggesting infectious/inflammatory pancolitis. No drainable fluid collection/abscess.  No free air. Small volume pelvic ascites. Electronically Signed   By: Charline BillsSriyesh  Krishnan M.D.   On: 12/15/2014 17:29   Medications:  Scheduled Meds: . ciprofloxacin  500 mg Oral BID  . enoxaparin (LOVENOX) injection  40 mg Subcutaneous Q24H  . metroNIDAZOLE  500 mg Oral BID   Continuous Infusions:  PRN Meds:.alum & mag hydroxide-simeth, ondansetron **OR** ondansetron (ZOFRAN) IV, temazepam, traMADol-acetaminophen Assessment/Plan:  Pancolitis: Continue to monitor for improvement on abx.  Will consider GI consult if lack of improvement for IBD query. - Will switch over antibiotics to IV for now given oral intolerance, Cipro 400 mg Q12H and Flagyl 500 mg Q8H (start date 11/10) - Stool culture pending - GI pathogen panel pending - C. Diff negative - D5 1/2 NS @ 100 ml/hr for 12 hours  - Zofran prn nausea, Mylanta prn - Discontinue Morphine 1mg  prn. Tramadol increased to 1-2 tablets PRN. - Tylenol prn abdominal pain - Advance diet as tolerated  Anxiety: Holding fluoxetine. Has not taken in 4 days.  Premenstrual dysphoria disorder: Holding progesterone. Has not taken yet.  Insomnia: Continue home temazepam 7.5 - 15 mg nightly.  Diet: Clears, advancing as tolerated  DVT Prophylaxis: Lovenox  Dispo: Disposition is deferred at this time, awaiting improvement of current medical problems.    The patient does have a current PCP Morrie Sheldon(Ashley Long, PA-C) and does not [facility] follow-[facility] appointment after  discharge.  The patient does not have transportation limitations that hinder transportation to clinic appointments.    Rich Numberarly Liliann File, MD, MPH Internal Medicine Resident, PGY-II Pager: [phone] 12/17/2014, 7:24 AM

## 2014-12-17 NOTE — Progress Notes (Signed)
Subjective: Victoria Ashley had 3 watery BMs last night, the last 5 hours ago. She notes new urgency and had a few accidents overnight, and her female friend in the room says that the stools look like they're still maroon-colored. She last had clear-colored emesis around 22:30, 1 h after Zofran, Cipro, Flagyl, and a "pop". She's not sure if the pop made her throw up. She has been able to tolerate water and asked for graham crackers. She denies further chills or feverishness but notes occasional dizziness on standing. She extols the morphine, which made her comfortable enough for about 5 hours of sleep last night, and has been ambulating back and forth to the toilet without discomfort.  Objective: Vital signs in last 24 hours: Filed Vitals:   12/16/14 0559 12/16/14 1352 12/16/14 2217 12/17/14 0540  BP: 118/69 121/76 133/77 106/66  Pulse: 80 59 59 63  Temp: 98.3 F (36.8 C) 98.3 F (36.8 C) 98.6 F (37 C) 98.2 F (36.8 C)  TempSrc: Oral Oral Oral Oral  Resp: Height:      Weight:      SpO2: 100% 100% 97% 97%    General: Much more comfortable-appearing woman, laying in bed in no acute distress CV: Regular rate and rhythm, no murmurs appreciated Pulm: No increased work of breathing, clear to auscultation bilaterally Abd: Tender to palpation at LUQ and RUQ, less tender over lower abdomen. BS +, hyperactive, less so than yesterday. Mildly distended, soft. Extremities: Warm, well-perfused, pulses 2+ bilaterally. No pedal edema or cyanosis noted. Neuro: A&O X 3, CN II-XII grossly in tact  Weight change:   Intake/Output Summary (Last 24 hours) at 12/17/14 0811 Last data filed at 12/16/14 1758  Gross per 24 hour  Intake   1640 ml  Output   1050 ml  Net    590 ml   CBC: 7.5 / 12.0 / 37.1 / 150 BG: 74 BMP: 140 / 4.2 / 106 / 28 / (pending) / 0.71  GI Pathogen Panel pending, collected 0600  Studies/Results: Ct Abdomen Pelvis W Contrast  12/15/2014  CLINICAL DATA:  Lower  abdominal pain, nausea/vomiting x2 days EXAM: CT ABDOMEN AND PELVIS WITH CONTRAST TECHNIQUE: Multidetector CT imaging of the abdomen and pelvis was performed using the standard protocol following bolus administration of intravenous contrast. CONTRAST:  OMNIPAQUE IOHEXOL 300 MG/ML  SOLN COMPARISON:  None. FINDINGS: Lower chest:  Lung bases are clear. Hepatobiliary: Liver is within normal limits. No suspicious/enhancing hepatic lesions. Gallbladder is unremarkable. No intrahepatic or extrahepatic ductal dilatation. Pancreas: Within normal limits. Spleen: Calcified splenic granulomata. Adrenals/Urinary Tract: Adrenal glands are within normal limits. Kidneys are within normal limits.  No hydronephrosis. Bladder is within normal limits. Stomach/Bowel: Stomach is within normal limits. No evidence of bowel obstruction. Normal appendix (series 2/ image 64). Diffuse colonic wall thickening, most prominently involving the right colon near the hepatic flexure (series 2/ image 45), suggesting infectious/ inflammatory colitis. Vascular/Lymphatic: No evidence of abdominal aortic aneurysm. No suspicious abdominopelvic lymphadenopathy. Reproductive: Uterus is within normal limits. Bilateral ovaries are within normal limits. Other: Small volume pelvic ascites. No drainable fluid collection/abscess. No free air. Musculoskeletal: Visualized osseous structures are within normal limits. IMPRESSION: Diffuse colonic wall thickening, most prominently involving the hepatic flexure, suggesting infectious/inflammatory pancolitis. No drainable fluid collection/abscess.  No free air. Small volume pelvic ascites. Electronically Signed   By: Charline Bills M.D.   On: 12/15/2014 17:29   Assessment/Plan:  1. Abd pain/Nausea/Diarrhea: Her abd pain is under  better control today, although still tender. Bowel sounds remain quite active. Morphine d/ced, Ultracet increased for pain. Nausea overnight responded to Phenergan and Maalox, has not  returned; will give a dose of Cipro IV since pt not taking PO well yet. Switch back to PO once keeping down bland diet. Stool pathogen, culture pending; afebrile, WBCs not elevated. BMP improved this morning after IV fluid replacement. Euvolemic, mild dizziness likely due to poor po intake, not symptomatic anemia. BPs stable. - 1 dose Cipro IV while not taking food; continue Flagyl - Zofran prn, one-time Phenergan last night  -   Mylanta prn - Ultracet prn for moderate pain, increased to 1-2 tablets -   Tylenol prn   -   D/c Morphine - Stool pathogen panel, culture pending - C diff neg - Replete electrolytes as needed -   D5 1/2 NS at 100 cc/hr for 12 hrs, K replete this AM. - Advance diet as tolerated--does not like green jello  2. Hypoglycemia: Stable today. Likely s/t GI losses, poor po intake.  3. Anxiety - Hold home Fluoxetine, hasn't taken in 4 days  4. Premenstrual dysphoria disorder:  - Hold home progesterone for now pending better oral intake  5. Insomnia:  - Continue home temazepam 7.5 - 15 mg nightly.  Diet: Clears, advance as tolerated Ppx: Lovenox  Dispo: Est today or tomorrow, pending continued sx resolution  This is a Psychologist, occupationalMedical Student Note.  The care of the patient was discussed with Dr. Beckie Saltsivet, and the assessment and plan formulated with their assistance.  Please see their attached note for official documentation of the daily encounter.     Bertha StakesJennifer Sterling Ucci, Med Student 12/17/2014, 8:11 AM

## 2014-12-18 ENCOUNTER — Encounter (HOSPITAL_COMMUNITY): Payer: Self-pay | Admitting: *Deleted

## 2014-12-18 LAB — BASIC METABOLIC PANEL
Anion gap: 5 (ref 5–15)
CALCIUM: 8.7 mg/dL — AB (ref 8.9–10.3)
CHLORIDE: 105 mmol/L (ref 101–111)
CO2: 31 mmol/L (ref 22–32)
CREATININE: 0.79 mg/dL (ref 0.44–1.00)
GFR calc non Af Amer: >60 mL/min (ref >60)
Glucose, Bld: 88 mg/dL (ref 65–99)
Potassium: 4 mmol/L (ref 3.5–5.1)
SODIUM: 141 mmol/L (ref 135–145)

## 2014-12-18 MED ORDER — HYDROCODONE-ACETAMINOPHEN 5-325 MG PO TABS
1.0000 | ORAL_TABLET | ORAL | Status: DC | PRN
  Administered 2014-12-18 (×2): 1 via ORAL
  Administered 2014-12-18 – 2014-12-19 (×2): 2 via ORAL
  Administered 2014-12-19: 1 via ORAL
  Administered 2014-12-19: 2 via ORAL
  Filled 2014-12-18: qty 2
  Filled 2014-12-18: qty 1
  Filled 2014-12-18: qty 2
  Filled 2014-12-18: qty 1
  Filled 2014-12-18 (×2): qty 2

## 2014-12-18 MED ORDER — PROMETHAZINE HCL 25 MG PO TABS
12.5000 mg | ORAL_TABLET | Freq: Four times a day (QID) | ORAL | Status: DC | PRN
  Administered 2014-12-18 – 2014-12-19 (×3): 12.5 mg via ORAL
  Filled 2014-12-18 (×3): qty 1

## 2014-12-18 MED ORDER — PROMETHAZINE HCL 25 MG/ML IJ SOLN
12.5000 mg | Freq: Four times a day (QID) | INTRAMUSCULAR | Status: DC | PRN
  Administered 2014-12-18 (×3): 12.5 mg via INTRAVENOUS
  Filled 2014-12-18 (×3): qty 1

## 2014-12-18 MED ORDER — DEXTROSE-NACL 5-0.45 % IV SOLN
INTRAVENOUS | Status: AC
  Administered 2014-12-18: 10:00:00 via INTRAVENOUS

## 2014-12-18 MED ORDER — CIPROFLOXACIN IN D5W 400 MG/200ML IV SOLN
400.0000 mg | Freq: Two times a day (BID) | INTRAVENOUS | Status: DC
  Administered 2014-12-18: 400 mg via INTRAVENOUS
  Filled 2014-12-18 (×2): qty 200

## 2014-12-18 MED ORDER — METRONIDAZOLE 500 MG PO TABS
500.0000 mg | ORAL_TABLET | Freq: Three times a day (TID) | ORAL | Status: DC
  Administered 2014-12-18 – 2014-12-19 (×2): 500 mg via ORAL
  Filled 2014-12-18 (×2): qty 1

## 2014-12-18 MED ORDER — CIPROFLOXACIN HCL 500 MG PO TABS
500.0000 mg | ORAL_TABLET | Freq: Two times a day (BID) | ORAL | Status: DC
  Administered 2014-12-18 – 2014-12-19 (×2): 500 mg via ORAL
  Filled 2014-12-18 (×2): qty 1

## 2014-12-18 NOTE — Progress Notes (Signed)
Subjective: Patient reports 4-5 episodes of diarrhea overnight. She notes there was some dark blood present in the stool. She also reports nausea, but no vomiting. Has only been able to eat a small amount of broth and a few bites of jello. She did well with crackers yesterday but was told she couldn't have any more by nursing. Will discuss with nurse that she can have crackers.   Objective: Vital signs in last 24 hours: Filed Vitals:   12/16/14 2217 12/17/14 0540 12/17/14 2122 12/18/14 0447  BP: 133/77 106/66 109/77 113/72  Pulse: 59 63 74 92  Temp: 98.6 F (37 C) 98.2 F (36.8 C) 98.4 F (36.9 C) 98.1 F (36.7 C)  TempSrc: Oral Oral Oral Oral  Resp: Height:      Weight:      SpO2: 97% 97% 97% 97%   Weight change:   Intake/Output Summary (Last 24 hours) at 12/18/14 1191 Last data filed at 12/18/14 0447  Gross per 24 hour  Intake   1425 ml  Output      0 ml  Net   1425 ml   General: young woman resting in bed, NAD, uncomfortable appearing HEENT: [place]/AT, EOMI, no scleral icterus, mucus membranes dry CV: RRR, no m/g/r Pulm: CTA bilaterally  Abd: BS+, soft, mildly tender to palpation  Ext: warm and well perfused, no pedal edema Neuro: alert and oriented X 3, no focal deficits   Lab Results: Basic Metabolic Panel:  Recent Labs Lab 12/17/14 0402 12/18/14 0201  NA 140 141  K 4.2 4.0  CL 106 105  CO2 28 31  GLUCOSE 74 88  BUN <5* <5*  CREATININE 0.71 0.79  CALCIUM 8.7* 8.7*   Liver Function Tests:  Recent Labs Lab 12/15/14 1305  AST 25  ALT 20  ALKPHOS 69  BILITOT 0.4  PROT 6.7  ALBUMIN 3.9    Recent Labs Lab 12/15/14 1305  LIPASE 20   CBC:  Recent Labs Lab 12/15/14 1305 12/17/14 0402  WBC 10.3 7.5  NEUTROABS  --  4.7  HGB 14.2 12.0  HCT 42.1 37.1  MCV 89.2 90.0  PLT 176 150   CBG:  Recent Labs Lab 12/16/14 0650 12/16/14 0724  GLUCAP 51* 113*   Urinalysis:  Recent Labs Lab 12/15/14 1557  COLORURINE YELLOW    LABSPEC 1.012  PHURINE 6.5  GLUCOSEU NEGATIVE  HGBUR SMALL*  BILIRUBINUR NEGATIVE  KETONESUR >80*  PROTEINUR NEGATIVE  UROBILINOGEN 0.2  NITRITE NEGATIVE  LEUKOCYTESUR NEGATIVE    Micro Results: Recent Results (from the past 240 hour(s))  Urine culture     Status: None   Collection Time: 12/15/14  3:57 PM  Result Value Ref Range Status   Specimen Description URINE, RANDOM  Final   Special Requests NONE  Final   Culture NO GROWTH 1 DAY  Final   Report Status 12/16/2014 FINAL  Final  Stool culture     Status: None (Preliminary result)   Collection Time: 12/15/14  6:51 PM  Result Value Ref Range Status   Specimen Description STOOL  Final   Special Requests Normal  Final   Culture   Final    Culture reincubated for better growth Performed at [facility]    Report Status PENDING  Incomplete  C difficile quick scan w PCR reflex     Status: None   Collection Time: 12/16/14  9:08 AM  Result Value Ref Range Status   C Diff antigen NEGATIVE NEGATIVE  Final   C Diff toxin NEGATIVE NEGATIVE Final   C Diff interpretation Negative for toxigenic C. difficile  Final   Medications:  Scheduled Meds: . enoxaparin (LOVENOX) injection  40 mg Subcutaneous Q24H  . metronidazole  500 mg Intravenous Q8H   Continuous Infusions:  PRN Meds:.alum & mag hydroxide-simeth, ondansetron **OR** ondansetron (ZOFRAN) IV, temazepam, traMADol-acetaminophen Assessment/Plan:  Pancolitis: Day 3 of antibiotics and still no improvement in her diarrhea and abdominal pain. GI pathogen panel and stool culture still pending. Will likely need to consult GI for their input- IBD? - Continue Cipro 400 mg Q12H and Flagyl 500 mg Q8H (start date 11/10) - Stool culture pending - GI pathogen panel pending - C. Diff negative - D5 1/2 NS @ 100 ml/hr for 12 hours  - Zofran not helping. Will try Phenergan prn nausea, Mylanta prn - Stop Tramadol and switch to Norco - Tylenol prn abdominal pain - Advance diet as  tolerated  Anxiety: Holding fluoxetine.  Premenstrual dysphoria disorder: Holding progesterone. Has not taken yet.  Insomnia: Continue home temazepam 7.5 - 15 mg nightly.  Diet: Clears, advancing as tolerated  DVT Prophylaxis: Lovenox  Dispo: Disposition is deferred at this time, awaiting improvement of current medical problems.    The patient does have a current PCP Morrie Sheldon(Ashley Long, PA-C) and [facility] appointment after discharge.  The patient does not have transportation limitations that hinder transportation to clinic appointments.   LOS: 1 day  Rich Numberarly Rivet, MD, MPH Internal Medicine Resident, PGY-II Pager: [phone] 12/18/2014, 8:22 AM

## 2014-12-18 NOTE — Progress Notes (Signed)
[facility] lab called 1250 pm +[facility], shiga toxins, called to Allena KatzPatel MD at 1310.

## 2014-12-19 LAB — CBC
HCT: 36.9 % (ref 36.0–46.0)
Hemoglobin: 12.1 g/dL (ref 12.0–15.0)
MCH: 29.4 pg (ref 26.0–34.0)
MCHC: 32.8 g/dL (ref 30.0–36.0)
MCV: 89.6 fL (ref 78.0–100.0)
PLATELETS: 177 10*3/uL (ref 150–400)
RBC: 4.12 MIL/uL (ref 3.87–5.11)
RDW: 13.2 % (ref 11.5–15.5)
WBC: 6.4 10*3/uL (ref 4.0–10.5)

## 2014-12-19 MED ORDER — HYDROCODONE-ACETAMINOPHEN 5-325 MG PO TABS: 1.0000 | ORAL_TABLET | ORAL | Status: DC | PRN

## 2014-12-19 MED ORDER — PROMETHAZINE HCL 12.5 MG PO TABS: 12.5000 mg | ORAL_TABLET | Freq: Four times a day (QID) | ORAL | Status: DC | PRN

## 2014-12-19 MED ORDER — PROMETHAZINE HCL 25 MG PO TABS
12.5000 mg | ORAL_TABLET | Freq: Once | ORAL | Status: AC
  Administered 2014-12-19: 12.5 mg via ORAL
  Filled 2014-12-19: qty 1

## 2014-12-19 NOTE — Progress Notes (Signed)
Subjective: Patient seen and examined this morning.  She reports improvement from previous day.  States her abdominal pain, nausea, and diarrhea are all doing better today.  Had 2 bowel movements overnight.  Reports they are dark black with blood in water.  Describes as loose but more formed than they have been.  She is tolerating advancement in her diet, saying that she ate a grilled cheese sandwich yesterday.  Stool culture is positive for EHEC, Shiga toxin.  Objective: Vital signs in last 24 hours: Filed Vitals:   12/18/14 0447 12/18/14 1409 12/18/14 2127 12/19/14 0544  BP: 113/72 107/70 112/66 100/66  Pulse: 92 76 69 74  Temp: 98.1 F (36.7 C) 98 F (36.7 C) 98.1 F (36.7 C) 98.2 F (36.8 C)  TempSrc: Oral Oral Oral   Resp: 16 16 17 16   Height:      Weight:      SpO2: 97% 97% 98% 97%   Weight change:   Intake/Output Summary (Last 24 hours) at 12/19/14 1011 Last data filed at 12/19/14 0544  Gross per 24 hour  Intake    700 ml  Output   2325 ml  Net  -1625 ml   General: resting in bed, uncomfortable appearing, no acute distress HEENT: EOMI, no scleral icterus CV: RRR Pulm: CTA bilaterally  Abd: BS+, soft, tender to palpation most noted in lower quadrants Ext: warm and well perfused, no pedal edema Neuro: alert and oriented X 3, no focal deficits   Lab Results: Basic Metabolic Panel:  Recent Labs Lab 12/17/14 0402 12/18/14 0201  NA 140 141  K 4.2 4.0  CL 106 105  CO2 28 31  GLUCOSE 74 88  BUN <5* <5*  CREATININE 0.71 0.79  CALCIUM 8.7* 8.7*   Liver Function Tests:  Recent Labs Lab 12/15/14 1305  AST 25  ALT 20  ALKPHOS 69  BILITOT 0.4  PROT 6.7  ALBUMIN 3.9    Recent Labs Lab 12/15/14 1305  LIPASE 20   CBC:  Recent Labs Lab 12/17/14 0402 12/19/14 0257  WBC 7.5 6.4  NEUTROABS 4.7  --   HGB 12.0 12.1  HCT 37.1 36.9  MCV 90.0 89.6  PLT 150 177   CBG:  Recent Labs Lab 12/16/14 0650 12/16/14 0724  GLUCAP 51* 113*    Urinalysis:  Recent Labs Lab 12/15/14 1557  COLORURINE YELLOW  LABSPEC 1.012  PHURINE 6.5  GLUCOSEU NEGATIVE  HGBUR SMALL*  BILIRUBINUR NEGATIVE  KETONESUR >80*  PROTEINUR NEGATIVE  UROBILINOGEN 0.2  NITRITE NEGATIVE  LEUKOCYTESUR NEGATIVE    Micro Results: Recent Results (from the past 240 hour(s))  Urine culture     Status: None   Collection Time: 12/15/14  3:57 PM  Result Value Ref Range Status   Specimen Description URINE, RANDOM  Final   Special Requests NONE  Final   Culture NO GROWTH 1 DAY  Final   Report Status 12/16/2014 FINAL  Final  Stool culture     Status: None (Preliminary result)   Collection Time: 12/15/14  6:51 PM  Result Value Ref Range Status   Specimen Description STOOL  Final   Special Requests Normal  Final   Culture   Final    Positive for Shiga toxins 1 and 2 NO SALMONELLA, SHIGELLA, CAMPYLOBACTER, OR YERSINIA ISOLATED Note: CRITICAL RESULT CALLED TO, READ BACK BY AND VERIFIED WITH: Bing PlumeERRI K RN @1243PM  [facility]14 Performed at [facility]    Report Status PENDING  Incomplete  C difficile quick scan  w PCR reflex     Status: None   Collection Time: 12/16/14  9:08 AM  Result Value Ref Range Status   C Diff antigen NEGATIVE NEGATIVE Final   C Diff toxin NEGATIVE NEGATIVE Final   C Diff interpretation Negative for toxigenic C. difficile  Final   Medications:  Scheduled Meds: . enoxaparin (LOVENOX) injection  40 mg Subcutaneous Q24H   Continuous Infusions:  PRN Meds:.alum & mag hydroxide-simeth, HYDROcodone-acetaminophen, promethazine, temazepam Assessment/Plan:  Pancolitis: completed 3 days of antibiotics, started to improve in last 24 hours from diarrhea and abdominal pain standpoint.  Stool culture positive for EHEC/Shiga toxin.  Lost IV access overnight.  Platelet count stable, hemoglobin stable, kidney function stable - Discontinue antibiotics as can be associated with increased production of toxins without reducing duration  of diarrhea - GI pathogen panel pending - Phenergan seems to be helping more with nausea.  Continue 12.5mg  q6h prn.  Continue Maalox q6h prn. - Norco 1-2 tablets q4h prn - Continue to advance diet as tolerated  Anxiety: Holding fluoxetine.  Premenstrual dysphoria disorder: Holding progesterone. Has not taken yet.  Insomnia: Continue home temazepam 7.5 - 15 mg nightly.  Diet: Regular, advancing as tolerated   DVT Prophylaxis: Lovenox   Dispo: will monitor through this afternoon for continued improvement.  Possible discharge today.  The patient does have a current PCP Morrie Sheldon Long, PA-C) and does not need an [facility] hospital follow-up appointment after discharge.  The patient does not have transportation limitations that hinder transportation to clinic appointments.  Gwynn Burly, DO 12/19/2014, 10:11 AM PGY-1, [facility] Pager: [phone]

## 2014-12-19 NOTE — Progress Notes (Signed)
Pt discharged to home.  Discharge instructions explained to pt.  Pt has no questions at the time of discharge.  Pt had no IV access at discharge.  Pt states she has all belongings.  Pt taken off unit by NT via wheelchair.

## 2014-12-20 LAB — GI PATHOGEN PANEL BY PCR, STOOL
C difficile toxin A/B: NOT DETECTED
CRYPTOSPORIDIUM BY PCR: NOT DETECTED
Campylobacter by PCR: NOT DETECTED
E COLI (ETEC) LT/ST: NOT DETECTED
E COLI 0157 BY PCR: NOT DETECTED
E coli (STEC): NOT DETECTED
G LAMBLIA BY PCR: NOT DETECTED
Norovirus GI/GII: NOT DETECTED
Rotavirus A by PCR: NOT DETECTED
Salmonella by PCR: NOT DETECTED
Shigella by PCR: NOT DETECTED

## 2014-12-20 NOTE — Discharge Summary (Signed)
Name: Victoria Victoria MRN: [id number] DOB: Aug 21, 1975 39 y.o. PCP: Victoria Magnus, Ashley  Date of Admission: 12/15/2014  1:17 PM Date of Discharge: 12/19/2014 Attending Physician: No att. providers found  Discharge Diagnosis: Principal Problem:   Pancolitis (HCC) Active Problems:   Diarrhea   Emesis  Discharge Medications:   Medication List    TAKE these medications        AIRBORNE PO  Take 1 capsule by mouth daily as needed (prevention).     cholecalciferol 1000 UNITS tablet  Commonly known as:  VITAMIN D  Take 1,000 Units by mouth daily.     FLUoxetine 10 MG capsule  Commonly known as:  PROZAC  Take 10 mg by mouth daily.     HYDROcodone-acetaminophen 5-325 MG tablet  Commonly known as:  NORCO/VICODIN  Take 1-2 tablets by mouth every 4 (four) hours as needed for moderate pain.     progesterone 200 MG capsule  Commonly known as:  PROMETRIUM  Take 200 mg by mouth daily.     promethazine 12.5 MG tablet  Commonly known as:  PHENERGAN  Take 1 tablet (12.5 mg total) by mouth every 6 (six) hours as needed for nausea or vomiting.     temazepam 7.5 MG capsule  Commonly known as:  RESTORIL  Take 7.5-15 mg by mouth at bedtime as needed. sleep     vitamin B-12 1000 MCG tablet  Commonly known as:  CYANOCOBALAMIN  Take 1,000 mcg by mouth daily.        Disposition and follow-up:   Ms.Victoria Victoria was discharged from [facility] in Stable condition.  At the hospital follow up visit please address:  1.  Patients abdominal pain and improvement from bacterial gastroenteritis and colitis.  2.  Labs / imaging needed at time of follow-up: none  3.  Pending labs/ test needing follow-up: Stool culture, GI pathogen panel  Follow-up Appointments: Follow-up Information    Follow up with LONG,Victoria Victoria. Schedule an appointment as soon as possible for a visit in 1 week.   Specialty:  Physician Assistant   Contact information:   [address] [place] [address] [phone]       Discharge Instructions: Discharge Instructions    Diet - low sodium heart healthy    Complete by:  As directed      Increase activity slowly    Complete by:  As directed            Consultations:    Procedures Performed:  Ct Abdomen Pelvis W Contrast  12/15/2014  CLINICAL DATA:  Lower abdominal pain, nausea/vomiting x2 days EXAM: CT ABDOMEN AND PELVIS WITH CONTRAST TECHNIQUE: Multidetector CT imaging of the abdomen and pelvis was performed using the standard protocol following bolus administration of intravenous contrast. CONTRAST:  OMNIPAQUE IOHEXOL 300 MG/ML  SOLN COMPARISON:  None. FINDINGS: Lower chest:  Lung bases are clear. Hepatobiliary: Liver is within normal limits. No suspicious/enhancing hepatic lesions. Gallbladder is unremarkable. No intrahepatic or extrahepatic ductal dilatation. Pancreas: Within normal limits. Spleen: Calcified splenic granulomata. Adrenals/Urinary Tract: Adrenal glands are within normal limits. Kidneys are within normal limits.  No hydronephrosis. Bladder is within normal limits. Stomach/Bowel: Stomach is within normal limits. No evidence of bowel obstruction. Normal appendix (series 2/ image 64). Diffuse colonic wall thickening, most prominently involving the right colon near the hepatic flexure (series 2/ image 45), suggesting infectious/ inflammatory colitis. Vascular/Lymphatic: No evidence of abdominal aortic aneurysm. No suspicious abdominopelvic lymphadenopathy. Reproductive: Uterus  is within normal limits. Bilateral ovaries are within normal limits. Other: Small volume pelvic ascites. No drainable fluid collection/abscess. No free air. Musculoskeletal: Visualized osseous structures are within normal limits. IMPRESSION: Diffuse colonic wall thickening, most prominently involving the hepatic flexure, suggesting infectious/inflammatory pancolitis. No drainable fluid collection/abscess.  No free air. Small volume pelvic  ascites. Electronically Signed   By: Charline BillsSriyesh  Krishnan M.D.   On: 12/15/2014 17:29    2D Echo: none  Cardiac Cath: none  Admission HPI: Victoria Victoria is a 39 year old woman with no obviously contributory past medical history who presents with nausea, vomiting, and diarrhea. On the morning of 10/8, she had watery, occasionally bloody, diarrhea for about four hours that eventually resolved later in the day. Her symptoms returned on 10/9, this time associated with nausea and non-bloody, non-bilious vomiting. She has had very low appetite and has been unable to keep anything down. Victoria Victoria hasn't taken her temperature, but she has had chills and endorses some numbness and tingling in her extremities and presyncopal symptoms. She has had lower abdominal pain as well. She attempted taking Pepto-Bismol, but it was unhelpful. She reports going on a hike last week, but said that she did not drink any water. She denies any sick contacts, having children in daycare, recent antibiotic use, or travel. She reports that this is first time this has happened to her, cannot recall any family members with similar symptoms, and cannot identify any family members with autoimmune disorders. Otherwise, she denies any headache, chest pain, shortness of breath, polyuria, dysuria, vaginal discharge, or unintended weight loss. [facility] was [facility] [place] on [place]/10 for these symptoms and was advised to go to the ED to assess for dehydration and electrolyte imbalances.  Her other medical issues are insomnia for which she takes temazepam, anxiety for which she take fluoxetine (hasn't taken in 3 days), and premenstrual dysphoria for which she was planning to take progesterone (has not started yet). She also has a history of infertility and sought donor oocytes in the past. She is premenopausal and has not had a period in 8 months. She has a 5 pack-year history, currently smoking 1/4 ppd. Does not use drugs  or alcohol. She is only sexually active with her husband. 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   In the ED, she was afebrile without a leukocytosis. There were no electrolyte abnormalities or evidence of an AKI. A UA did not suggest a UTI. A pregnancy test was negative. Lipase was normal. A CT of the abdomen and pelvis with contrast demonstrated diffuse colonic wall thickening suggestive of an infectious/inflammatory pancolitis with a small volume of pelvic ascites. A stool culture was obtained. She received 1L NS in the ED.  Hospital Course by problem list: Principal Problem:   Pancolitis (HCC) Active Problems:   Diarrhea   Emesis   1. Pancolitis: patient completed 3 days of antibiotics before noticing improvement in diarrhea and abdominal pain.  On this 3rd day, stool culture returned positive for Shiga toxin without resulting organism at time of discharge.  Antibiotics were discontinued at this point due to concern for setting off HUS.  Her platelets were stable, hemoglobin stable, and kidney function stable.  GI pathogen panel and final stool culture reports were pending at time of discharge.  She was sent home with recommendation to advance diet slowly with bland diet as well as given short course of pain medications and anti-emetics.  2.  Anxiety: her Fluoxetine was held as she had not been taking prior to admission for 4 days.  3.  Insomnia: her Temazepam 7.5-15mg  QHS were continued.  Discharge Vitals:   BP 111/74 mmHg  Pulse 74  Temp(Src) 98.2 F (36.8 C) (Oral)  Resp 16  Ht  (1.626 m)  Wt 114 lb (51.71 kg)  BMI 19.56 kg/m2  SpO2 98%  Discharge Labs:  No results found for this or any previous visit (from the past 24 hour(s)).  Signed: Gwynn Burly, DO 12/20/2014, 2:34 PM    Services Ordered on Discharge: none Equipment Ordered on Discharge: none

## 2015-01-12 LAB — HM COLONOSCOPY

## 2015-01-16 LAB — STOOL CULTURE
CULTURE: POSITIVE
SPECIAL REQUESTS: NORMAL

## 2015-06-14 DIAGNOSIS — G2581 Restless legs syndrome: Secondary | ICD-10-CM | POA: Diagnosis not present

## 2015-06-14 DIAGNOSIS — F411 Generalized anxiety disorder: Secondary | ICD-10-CM | POA: Diagnosis not present

## 2015-06-14 DIAGNOSIS — F17210 Nicotine dependence, cigarettes, uncomplicated: Secondary | ICD-10-CM | POA: Diagnosis not present

## 2015-08-16 DIAGNOSIS — F17210 Nicotine dependence, cigarettes, uncomplicated: Secondary | ICD-10-CM | POA: Diagnosis not present

## 2015-08-16 DIAGNOSIS — F411 Generalized anxiety disorder: Secondary | ICD-10-CM | POA: Diagnosis not present

## 2015-08-16 DIAGNOSIS — G2581 Restless legs syndrome: Secondary | ICD-10-CM | POA: Diagnosis not present

## 2015-12-15 DIAGNOSIS — F411 Generalized anxiety disorder: Secondary | ICD-10-CM | POA: Diagnosis not present

## 2015-12-15 DIAGNOSIS — F39 Unspecified mood [affective] disorder: Secondary | ICD-10-CM | POA: Diagnosis not present

## 2015-12-15 DIAGNOSIS — F17210 Nicotine dependence, cigarettes, uncomplicated: Secondary | ICD-10-CM

## 2015-12-15 DIAGNOSIS — Z0000 Encounter for general adult medical examination without abnormal findings: Secondary | ICD-10-CM | POA: Diagnosis not present

## 2015-12-15 HISTORY — DX: Nicotine dependence, cigarettes, uncomplicated: F17.210

## 2016-01-24 DIAGNOSIS — Z803 Family history of malignant neoplasm of breast: Secondary | ICD-10-CM | POA: Diagnosis not present

## 2016-01-24 DIAGNOSIS — Z8041 Family history of malignant neoplasm of ovary: Secondary | ICD-10-CM | POA: Diagnosis not present

## 2016-02-16 DIAGNOSIS — Z1379 Encounter for other screening for genetic and chromosomal anomalies: Secondary | ICD-10-CM | POA: Insufficient documentation

## 2016-02-16 HISTORY — DX: Encounter for other screening for genetic and chromosomal anomalies: Z13.79

## 2016-06-18 DIAGNOSIS — Z8719 Personal history of other diseases of the digestive system: Secondary | ICD-10-CM | POA: Diagnosis not present

## 2016-06-18 DIAGNOSIS — G2581 Restless legs syndrome: Secondary | ICD-10-CM | POA: Diagnosis not present

## 2016-06-18 DIAGNOSIS — F411 Generalized anxiety disorder: Secondary | ICD-10-CM | POA: Diagnosis not present

## 2016-06-18 DIAGNOSIS — F5101 Primary insomnia: Secondary | ICD-10-CM | POA: Diagnosis not present

## 2016-09-17 NOTE — Progress Notes (Signed)
New patient f Assessment and Plan:  Abscess -     sulfamethoxazole-trimethoprim (BACTRIM DS,SEPTRA DS) 800-160 MG tablet; Take 1 tablet by mouth 2 (two) times daily. - will schedule follow up for removal  Depression, major, recurrent, in partial remission (HCC) Increase prozac -     FLUoxetine (PROZAC) 20 MG capsule; Take 1 capsule (20 mg total) by mouth daily. -     ALPRAZolam (XANAX) 1 MG tablet; 1/2-1 tablet at night for sleep  RLS (restless legs syndrome) -     ALPRAZolam (XANAX) 1 MG tablet; 1/2-1 tablet at night for sleep  Insomnia, unspecified type Discussed that benzo is not good long term for sleep, patient agrees but with acute stress will RX but will switch to gabapentin for hot flashes/RLS in the future for her to try.  -     ALPRAZolam (XANAX) 1 MG tablet; 1/2-1 tablet at night for sleep   Needs MGM and TDAP Discussed med's effects and SE's. Screening labs and tests as requested with regular follow-up as recommended. Will get CPE in 3 months  HPI  41 y.o. female  presents for a complete physical and follow up for has Depression, major, recurrent, in partial remission (HCC); RLS (restless legs syndrome); and Insomnia on her problem list..  She has been having issues with her brother and nephew, has not seen her nephew in over a year, got news that they are no longer going to fight for custody and this has up set her greatly. She feels that she has no concentration, she had decreased motivation. She got off her temazepam due to it being a benzo and she does not want to take it daily however she did take a 30 mg the other night to help with sleep due to stress. She is only on prozac 10mg  for hot flashes.   She had labs done 12/2015 for CPE.  Vitamin D was 57 B12 was over 2000  Current Medications:  Current Outpatient Prescriptions on File Prior to Visit  Medication Sig Dispense Refill  . cholecalciferol (VITAMIN D) 1000 UNITS tablet Take 1,000 Units by mouth daily.    .  Multiple Vitamins-Minerals (AIRBORNE PO) Take 1 capsule by mouth daily as needed (prevention).    . vitamin B-12 (CYANOCOBALAMIN) 1000 MCG tablet Take 1,000 mcg by mouth daily.     No current facility-administered medications on file prior to visit.    Allergies:  No Known Allergies Medical History:  She has Depression, major, recurrent, in partial remission (HCC); RLS (restless legs syndrome); and Insomnia on her problem list.  Health Maintenance:   Tetanus: DUE but declines today Pneumovax: Prevnar 13:  Flu vaccine: Zostavax:  Pap: 2 years, never abnormal pap MGM: Needs to get DEXA: Colonoscopy: 2016 [facility]:  Patient Care Team: Lucky CowboyMcKeown, William, MD as PCP - General (Internal Medicine)  Surgical History:  She has no past surgical history on file. Family History:  Herfamily history includes Alcohol abuse in her father; Cancer (age of onset: 2250) in her mother; Cancer (age of onset: 251) in her maternal grandmother; Diabetes (age of onset: 8043) in her father; Heart disease in her maternal grandfather. Social History:  She reports that she has been smoking Cigarettes.  She has a 5.00 pack-year smoking history. She has never used smokeless tobacco. She reports that she does not drink alcohol or use drugs.  Review of Systems: Review of Systems  Constitutional: Negative.   HENT: Negative.   Eyes: Negative.   Respiratory:  Negative.   Cardiovascular: Negative.   Gastrointestinal: Negative.   Genitourinary: Negative.   Musculoskeletal: Negative.   Skin: Positive for rash. Negative for itching.  Psychiatric/Behavioral: Positive for depression. Negative for hallucinations, memory loss, substance abuse and suicidal ideas. The patient is nervous/anxious and has insomnia.     Physical Exam: Estimated body mass index is 18.4 kg/m as calculated from the following:   Height as of this encounter: 5\' 4"  (1.626 m).   Weight as of this encounter: 107 lb 3.2 oz (48.6 kg). BP  118/80   Pulse 74   Temp (!) 97.3 F (36.3 C)   Resp 16   Ht 5\' 4"  (1.626 m)   Wt 107 lb 3.2 oz (48.6 kg)   SpO2 98%   BMI 18.40 kg/m  General Appearance: Well nourished, in no apparent distress.  Eyes: PERRLA, EOMs, conjunctiva no swelling or erythema, normal fundi and vessels.  Sinuses: No Frontal/maxillary tenderness  ENT/Mouth: Ext aud canals clear, normal light reflex with TMs without erythema, bulging. Good dentition. No erythema, swelling, or exudate on post pharynx. Tonsils not swollen or erythematous. Hearing normal.  Neck: Supple, thyroid normal. No bruits  Respiratory: Respiratory effort normal, BS equal bilaterally without rales, rhonchi, wheezing or stridor.  Cardio: RRR without murmurs, rubs or gallops. Brisk peripheral pulses without edema.  Chest: symmetric, with normal excursions and percussion.  Abdomen: Soft, nontender, no guarding, rebound, hernias, masses, or organomegaly.  Lymphatics: Non tender without lymphadenopathy.  Musculoskeletal: Full ROM all peripheral extremities,5/5 strength, and normal gait.  Skin: Left scapula with 0.5cm x 0.5cm abscess/cyst Warm, dry without rashes, lesions, ecchymosis. Neuro: Cranial nerves intact, reflexes equal bilaterally. Normal muscle tone, no cerebellar symptoms. Sensation intact.  Psych: Awake and oriented X 3, normal affect, Insight and Judgment appropriate.    Quentin Mulling 9:58 AM [facility]

## 2016-09-18 ENCOUNTER — Ambulatory Visit (INDEPENDENT_AMBULATORY_CARE_PROVIDER_SITE_OTHER): Payer: BLUE CROSS/BLUE SHIELD | Admitting: Physician Assistant

## 2016-09-18 ENCOUNTER — Encounter: Payer: Self-pay | Admitting: Physician Assistant

## 2016-09-18 VITALS — BP 118/80 | HR 74 | Temp 97.3°F | Resp 16 | Ht 64.0 in | Wt 107.2 lb

## 2016-09-18 DIAGNOSIS — L0291 Cutaneous abscess, unspecified: Secondary | ICD-10-CM | POA: Diagnosis not present

## 2016-09-18 DIAGNOSIS — F3341 Major depressive disorder, recurrent, in partial remission: Secondary | ICD-10-CM | POA: Diagnosis not present

## 2016-09-18 DIAGNOSIS — G4700 Insomnia, unspecified: Secondary | ICD-10-CM

## 2016-09-18 DIAGNOSIS — G2581 Restless legs syndrome: Secondary | ICD-10-CM | POA: Diagnosis not present

## 2016-09-18 MED ORDER — SULFAMETHOXAZOLE-TRIMETHOPRIM 800-160 MG PO TABS: 1.0000 | ORAL_TABLET | Freq: Two times a day (BID) | ORAL | 0 refills | Status: AC

## 2016-09-18 MED ORDER — ALPRAZOLAM 1 MG PO TABS: ORAL_TABLET | ORAL | 1 refills | Status: DC

## 2016-09-18 MED ORDER — FLUOXETINE HCL 20 MG PO CAPS: 20.0000 mg | ORAL_CAPSULE | Freq: Every day | ORAL | 3 refills | Status: DC

## 2016-09-18 NOTE — Patient Instructions (Addendum)
[facility]  7 a.m.-6:30 p.m., Monday 7 a.m.-5 p.m., Tuesday-Friday Schedule an appointment by calling [phone].  Encourage you to get the 3D Mammogram  The 3D Mammogram is much more specific and sensitive to pick up breast cancer. For women with fibrocystic breast or lumpy breast it can be hard to determine if it is cancer or not but the 3D mammogram is able to tell this difference which cuts back on unneeded additional tests or scary call backs.   - over 40% increase in detection of breast cancer - over 40% reduction in false positives.  - fewer call backs - reduced anxiety - improved outcomes - [facility]  Increase prozac to 20mg  Can take 1/2-1 xanax as needed for sleep  11 Tips to Follow:  1. No caffeine after 3pm: Avoid beverages with caffeine (soda, tea, energy drinks, etc.) especially after 3pm. 2. Don't go to bed hungry: Have your evening meal at least 3 hrs. before going to sleep. It's fine to have a small bedtime snack such as a glass of milk and a few crackers but don't have a big meal. 3. Have a nightly routine before bed: Plan on "winding down" before you go to sleep. Begin relaxing about 1 hour before you go to bed. Try doing a quiet activity such as listening to calming music, reading a book or meditating. 4. Turn off the TV and ALL electronics including video games, tablets, laptops, etc. 1 hour before sleep, and keep them out of the bedroom. 5. Turn off your cell phone and all notifications (new email and text alerts) or even better, leave your phone outside your room while you sleep. Studies have shown that a part of your brain continues to respond to certain lights and sounds even while you're still asleep. 6. Make your bedroom quiet, dark and cool. If you can't control the noise, try wearing earplugs or using a fan to block out other sounds. 7. Practice relaxation techniques. Try reading a book or meditating or drain your brain by  writing a list of what you need to do the next day. 8. Don't nap unless you feel sick: you'll have a better night's sleep. 9. Don't smoke, or quit if you do. Nicotine, alcohol, and marijuana can all keep you awake. Talk to your health care provider if you need help with substance use. 10. Most importantly, wake up at the same time every day (or within 1 hour of your usual wake up time) EVEN on the weekends. A regular wake up time promotes sleep hygiene and prevents sleep problems. 11. Reduce exposure to bright light in the last three hours of the day before going to sleep. Maintaining good sleep hygiene and having good sleep habits lower your risk of developing sleep problems. Getting better sleep can also improve your concentration and alertness. Try the simple steps in this guide. If you still have trouble getting enough rest, make an appointment with your health care provider.

## 2016-09-18 NOTE — Progress Notes (Signed)
Xanax called into pharmacy on 15th Aug 2018 at 9:48am by DD

## 2016-09-24 ENCOUNTER — Other Ambulatory Visit: Payer: Self-pay | Admitting: Physician Assistant

## 2016-09-24 DIAGNOSIS — Z1231 Encounter for screening mammogram for malignant neoplasm of breast: Secondary | ICD-10-CM

## 2016-10-14 ENCOUNTER — Ambulatory Visit
Admission: RE | Admit: 2016-10-14 | Discharge: 2016-10-14 | Disposition: A | Discharge status: Home / Self Care | Payer: BLUE CROSS/BLUE SHIELD | Source: Ambulatory Visit | Attending: Physician Assistant | Admitting: Physician Assistant

## 2016-10-14 DIAGNOSIS — Z1231 Encounter for screening mammogram for malignant neoplasm of breast: Secondary | ICD-10-CM

## 2016-12-10 ENCOUNTER — Other Ambulatory Visit: Payer: Self-pay | Admitting: Physician Assistant

## 2016-12-10 ENCOUNTER — Other Ambulatory Visit: Payer: Self-pay

## 2016-12-10 MED ORDER — TEMAZEPAM 7.5 MG PO CAPS: 7.5000 mg | ORAL_CAPSULE | Freq: Every evening | ORAL | 1 refills | Status: DC | PRN

## 2016-12-10 MED ORDER — TEMAZEPAM 30 MG PO CAPS: 7.5000 mg | ORAL_CAPSULE | Freq: Every evening | ORAL | 0 refills | Status: DC | PRN

## 2016-12-10 NOTE — Progress Notes (Unsigned)
Future Appointments  Date Time Provider Department Center  12/31/2016  3:45 PM Quentin Mullingollier, Amanda, PA-C [id number] None

## 2016-12-11 ENCOUNTER — Other Ambulatory Visit: Payer: Self-pay

## 2016-12-11 MED ORDER — TEMAZEPAM 30 MG PO CAPS: 7.5000 mg | ORAL_CAPSULE | Freq: Every evening | ORAL | 0 refills | Status: DC | PRN

## 2016-12-11 NOTE — Progress Notes (Signed)
Restoril has been called into pharmacy on Nov 7th 2018 by DD

## 2016-12-31 ENCOUNTER — Encounter: Payer: Self-pay | Admitting: Physician Assistant

## 2017-01-02 ENCOUNTER — Other Ambulatory Visit: Payer: Self-pay | Admitting: Physician Assistant

## 2017-01-02 MED ORDER — PREDNISONE 20 MG PO TABS: ORAL_TABLET | ORAL | 0 refills | Status: DC

## 2017-01-05 ENCOUNTER — Other Ambulatory Visit: Payer: Self-pay | Admitting: Physician Assistant

## 2017-01-05 MED ORDER — AZITHROMYCIN 250 MG PO TABS: ORAL_TABLET | ORAL | 1 refills | Status: AC

## 2017-01-20 ENCOUNTER — Other Ambulatory Visit: Payer: Self-pay | Admitting: Physician Assistant

## 2017-01-20 MED ORDER — AZITHROMYCIN 250 MG PO TABS: ORAL_TABLET | ORAL | 1 refills | Status: AC

## 2017-01-20 MED ORDER — PREDNISONE 20 MG PO TABS: ORAL_TABLET | ORAL | 0 refills | Status: DC

## 2017-02-05 ENCOUNTER — Other Ambulatory Visit: Payer: Self-pay | Admitting: Physician Assistant

## 2017-02-05 MED ORDER — GABAPENTIN 300 MG PO CAPS: 300.0000 mg | ORAL_CAPSULE | Freq: Every day | ORAL | 0 refills | Status: DC

## 2017-02-07 ENCOUNTER — Ambulatory Visit: Payer: BLUE CROSS/BLUE SHIELD | Admitting: Physician Assistant

## 2017-02-07 ENCOUNTER — Encounter: Payer: Self-pay | Admitting: Physician Assistant

## 2017-02-07 VITALS — BP 114/72 | HR 64 | Temp 97.3°F | Resp 16 | Ht 64.0 in | Wt 110.4 lb

## 2017-02-07 DIAGNOSIS — Z79899 Other long term (current) drug therapy: Secondary | ICD-10-CM | POA: Diagnosis not present

## 2017-02-07 DIAGNOSIS — I10 Essential (primary) hypertension: Secondary | ICD-10-CM

## 2017-02-07 DIAGNOSIS — Z1389 Encounter for screening for other disorder: Secondary | ICD-10-CM

## 2017-02-07 DIAGNOSIS — Z13220 Encounter for screening for lipoid disorders: Secondary | ICD-10-CM | POA: Diagnosis not present

## 2017-02-07 DIAGNOSIS — Z0000 Encounter for general adult medical examination without abnormal findings: Secondary | ICD-10-CM | POA: Diagnosis not present

## 2017-02-07 DIAGNOSIS — E559 Vitamin D deficiency, unspecified: Secondary | ICD-10-CM

## 2017-02-07 DIAGNOSIS — Z1329 Encounter for screening for other suspected endocrine disorder: Secondary | ICD-10-CM

## 2017-02-07 DIAGNOSIS — M8580 Other specified disorders of bone density and structure, unspecified site: Secondary | ICD-10-CM

## 2017-02-07 DIAGNOSIS — N952 Postmenopausal atrophic vaginitis: Secondary | ICD-10-CM

## 2017-02-07 DIAGNOSIS — F3341 Major depressive disorder, recurrent, in partial remission: Secondary | ICD-10-CM

## 2017-02-07 DIAGNOSIS — G2581 Restless legs syndrome: Secondary | ICD-10-CM

## 2017-02-07 DIAGNOSIS — G43909 Migraine, unspecified, not intractable, without status migrainosus: Secondary | ICD-10-CM

## 2017-02-07 DIAGNOSIS — G4700 Insomnia, unspecified: Secondary | ICD-10-CM

## 2017-02-07 MED ORDER — GABAPENTIN 300 MG PO CAPS: 300.0000 mg | ORAL_CAPSULE | Freq: Every day | ORAL | 1 refills | Status: DC

## 2017-02-07 MED ORDER — ESTROGENS, CONJUGATED 0.625 MG/GM VA CREA: 1.0000 | TOPICAL_CREAM | Freq: Every day | VAGINAL | 12 refills | Status: DC

## 2017-02-07 MED ORDER — SUMATRIPTAN SUCCINATE 100 MG PO TABS
100.0000 mg | ORAL_TABLET | Freq: Once | ORAL | 1 refills | Status: DC | PRN
Start: 2017-02-07 — End: 2018-10-15

## 2017-02-07 NOTE — Patient Instructions (Addendum)
Check about TDAP shot, was it tetanus or TDAP and when was the last one  Premarin do 1/2 a tube (2 grams) every day for 2 weeks and then do 1/2 a tube 2-3 x a week at night.  If this does not help we can potential discuss osphena  Several types of vaginal estrogen products are available: ?Estrogen cream (eg, Premarin, Estrace cream) is inserted into the vagina every day for two to three weeks, and then one or two times weekly. The cream can be difficult to measure accurately and insert into the vagina. ?The vaginal estrogen tablet (Vagifem) is a small tablet that is inserted inside the vagina. The tablet is packaged in a disposable applicator. Vagifem is usually taken every day for two weeks and then twice weekly. ?The vaginal estrogen ring, called Estring, is a flexible plastic ring that is worn inside the vagina all the time. It is replaced every three months by the woman or her healthcare provider. The ring does not need to be removed during sex or bathing. It cannot be felt by most women or their sexual partners. In women who have previously had a hysterectomy, the ring will sometimes fall out.  Estring should not be confused with the estrogen replacement vaginal ring (Femring), which releases a much higher dose of estrogen and is intended to be absorbed into the body to relieve hot flashes.   How long can I use vaginal estrogen? - Vaginal estrogen is thought to be safe and can probably be used indefinitely, although there are no long-term studies confirming its safety.  Is vaginal estrogen safe for women with a history of breast cancer? - The safety of vaginal estrogen in women who have a past history of breast cancer is unclear. A small amount of estrogen can be absorbed from the vagina into the bloodstream. If you have a history of breast cancer, talk to your healthcare provider or your oncologist about the potential risks and benefits of vaginal estrogen.  Ospemifene - Ospemifene is a  prescription medication that is similar to estrogen, but is not estrogen. In the vaginal tissue, it acts similarly to estrogen. In the breast tissue, it acts as an estrogen blocker. It comes in a pill, and is prescribed for women who want to use an estrogen-like medication for vaginal dryness or painful sex associated with vaginal dryness, but prefer not to use a vaginal medication. The medication may cause hot flashes as a side effect. This type of medication may increase the risk of blood clots or uterine cancer. Further study of ospemifene is needed to evaluate the risk of these complications. This medication has not been tested in women who have had breast cancer or are at a high risk of developing breast cancer.     To prevent migraines you can try these natural/OTC medications as prevention: 1) Melatonin 5mg -15mg  30 mins before bed 2) Riboflavin/B2 400mg  daily 3) CoQ10 100mg  3 x a day  We may also treat TMJ if we think you have it If you are having frequent migraines we may put you on a once a day medication with fast acting medication to take. Also there is such a thing called rebound headache from over use of acute medications.  Please do not use rescue or acute medications more than 10 days a month or more than 3 days per week, this can cause a withdrawal and a rebound headache.  Here is more information below  Please remember, common headache triggers are: sleep deprivation,  dehydration, overheating, stress, hypoglycemia or skipping meals and blood sugar fluctuations, excessive pain medications or excessive alcohol use or caffeine withdrawal. Some people have food triggers such as aged cheese, orange juice or chocolate, especially dark chocolate, or MSG (monosodium glutamate). Try to avoid these headache triggers as much possible.   It may be helpful to keep a headache diary to figure out what makes your headaches worse or brings them on and what alleviates them. Some people report  headache onset after exercise but studies have shown that regular exercise may actually prevent headaches from coming. If you have exercise-induced headaches, please make sure that you drink plenty of fluid before and after exercising and that you do not over do it and do not overheat.   Please go to the ER if there is weakness, thunderclap headache, visual changes, or any concerning factors    Migraine Headache A migraine headache is an intense, throbbing pain on one or both sides of your head. Recurrent migraines keep coming back. A migraine can last for 30 minutes to several hours. CAUSES  The exact cause of a migraine headache is not always known. However, a migraine may be caused when nerves in the brain become irritated and release chemicals that cause inflammation. This causes pain. Certain things may also trigger migraines, such as:   Alcohol.  Smoking.  Stress.  Menstruation.  Aged cheeses.  Foods or drinks that contain nitrates, glutamate, aspartame, or tyramine.  Lack of sleep.  Chocolate.  Caffeine.  Hunger.  Physical exertion.  Fatigue.  Medicines used to treat chest pain (nitroglycerine), birth control pills, estrogen, and some blood pressure medicines. SYMPTOMS   Pain on one or both sides of your head.  Pulsating or throbbing pain.  Severe pain that prevents daily activities.  Pain that is aggravated by any physical activity.  Nausea, vomiting, or both.  Dizziness.  Pain with exposure to bright lights, loud noises, or activity.  General sensitivity to bright lights, loud noises, or smells. Before you get a migraine, you may get warning signs that a migraine is coming (aura). An aura may include:  Seeing flashing lights.  Seeing bright spots, halos, or zigzag lines.  Having tunnel vision or blurred vision.  Having feelings of numbness or tingling.  Having trouble talking.  Having muscle weakness. DIAGNOSIS  A recurrent migraine  headache is often diagnosed based on:  Symptoms.  Physical examination.  A CT scan or MRI of your head. These imaging tests cannot diagnose migraines but can help rule out other causes of headaches.  TREATMENT  Medicines may be given for pain and nausea. Medicines can also be given to help prevent recurrent migraines. HOME CARE INSTRUCTIONS  Only take over-the-counter or prescription medicines for pain or discomfort as directed by your health care provider. The use of long-term narcotics is not recommended.  Lie down in a dark, quiet room when you have a migraine.  Keep a journal to find out what may trigger your migraine headaches. For example, write down:  What you eat and drink.  How much sleep you get.  Any change to your diet or medicines.  Limit alcohol consumption.  Quit smoking if you smoke.  Get 7-9 hours of sleep, or as recommended by your health care provider.  Limit stress.  Keep lights dim if bright lights bother you and make your migraines worse. SEEK MEDICAL CARE IF:   You do not get relief from the medicines given to you.  You have a recurrence  of pain.  You have a fever. SEEK IMMEDIATE MEDICAL CARE IF:  Your migraine becomes severe.  You have a stiff neck.  You have loss of vision.  You have muscular weakness or loss of muscle control.  You start losing your balance or have trouble walking.  You feel faint or pass out. You have severe symptoms that are different from your first symptoms. MAKE SURE YOU:   Understand these instructions.  Will watch your condition.  Will get help right away if you are not doing well or get worse.   This information is not intended to replace advice given to you by your health care provider. Make sure you discuss any questions you have with your health care provider.   Document Released: 10/16/2000 Document Revised: 02/11/2014 Document Reviewed: 09/28/2012 Elsevier Interactive Patient Education 2016  [employer].  Common Migraine Triggers   Foods Aged cheese, alcohol, nuts, chocolate, yogurt, onions, figs, liver, caffeinated foods and beverages, monosodium glutamate (MSG), smoked or pickled fish/meat, nitrate/nitrate preserved foods (hotdogs, pepperoni, salami) tyramine  Medications Antibiotics (tetracycline, griseofulvin), antihypertensives (nifedipine, captopril), hormones (oral contraceptives, estrogens), histamine-2 blockers (cimetidine, raniidine, vasodilators (nitroglycerine, isosorbide dinitrate)  Sensory Stimuli Flickering/bright/fluorescent lights, bright sunlight, odors (perfume, chemicals, cigarette smoke)  Lifestyle Changes Time zones, sleep patterns, eating habits, caffeine withdrawal stress  Other Menstrual cycle, weather/season/air pressure changes, high altitude  Adapted from [place] and [place], [place]. Clin. J. Med. 1995; [place] and [place]ell. Conquering Headache, 1998  Hormonal variations also are believed to play a part.  Fluctuations of the female hormone estrogen (such as just before menstruation) affect a chemical called serotonin-when serotonin levels in the brain fall, the dilation (expansion) of blood vessels in the brain that is characteristic of migraine often follows.  Many factors or "triggers" can start a migraine.  In people who get migraines, most experts think certain activities or foods may trigger temporary changes in the blood vessels around the brain.  Swelling of these blood vessels may cause pain in the nearby nerves.  Allergy Headaches:  Hotdogs Milk  Onions  Thyme Bacon  Chocolate Garlic  Nutmeg Ham  Dark Cola Pork  Cinnamon Salami  Nuts  Egg  Ginger Sausage Red wine Cloves  Cheddar Cheese Caffeine

## 2017-02-07 NOTE — Progress Notes (Addendum)
Complete Physical  Assessment and Plan:  Depression, major, recurrent, in partial remission (HCC) Check dose of prozac at home Can increase to 40mg  if that is what she is on  RLS (restless legs syndrome) -     gabapentin (NEURONTIN) 300 MG capsule; Take 1 capsule (300 mg total) by mouth at bedtime. - get back to working out  Insomnia, unspecified type -     gabapentin (NEURONTIN) 300 MG capsule; Take 1 capsule (300 mg total) by mouth at bedtime. Can do xanax PRN for sleep but doing well on the Neurontin.   Migraine without status migrainosus, not intractable, unspecified migraine type -     SUMAtriptan (IMITREX) 100 MG tablet; Take 1 tablet (100 mg total) by mouth once as needed for migraine. May repeat in 2 hours if headache persists or recurs. - adding gabapentin might help - if any neuro symptoms   Vaginal atrophy -     conjugated estrogens (PREMARIN) vaginal cream; Place 1 Applicatorful vaginally daily.  Osteopenia, unspecified location -     DG Bone Density; Future  Screening cholesterol level -     Lipid panel  Vitamin D deficiency -     VITAMIN D 25 Hydroxy (Vit-D Deficiency, Fractures)  Medication management -     CBC with Differential/Platelet -     BASIC METABOLIC PANEL WITH GFR -     Hepatic function panel -     Magnesium  Screening for hematuria or proteinuria -     Urinalysis, Routine w reflex microscopic -     Microalbumin / creatinine urine ratio  Screening for thyroid disorder -     TSH    Discussed med's effects and SE's. Screening labs and tests as requested with regular follow-up as recommended. Over 40 minutes of exam, counseling, chart review, and complex, high level critical decision making was performed this visit.   HPI  42 y.o. female  presents for a complete physical and follow up for has Depression, major, recurrent, in partial remission (HCC); RLS (restless legs syndrome); and Insomnia on their problem list..  Her blood pressure has  been controlled at home, today their BP is BP: 114/72 She does workout. She denies chest pain, shortness of breath, dizziness.   She is on prozac 40mg  for hot flashes and depression. She was taking temazepam 30mg  for sleep but was hoping to try something that was not habit forming. Just start on gabapentin 300mg  and she is doing well with it, helping decreasing night sweats.  She does have vaginal dryness, has tried OTC that does not help.  She has empty egg syndrome and early menopause, strong family history of breast cancer and grandmother with ovarian cancer. Had normal MGM last OV.   She is on vitamin D and B12 supplement.   Has history of migraines but has not had for 5-6 years except [place] in [place], had typical HA symptoms, nausea, vomiting. Requesting acute medication in case.   BMI is Body mass index is 18.95 kg/m., she is working on diet and exercise. Wt Readings from Last 3 Encounters:  02/07/17 110 lb 6.4 oz (50.1 kg)  09/18/16 107 lb 3.2 oz (48.6 kg)  12/15/14 114 lb (51.7 kg)     Current Medications:  Current Outpatient Medications on File Prior to Visit  Medication Sig Dispense Refill  . cholecalciferol (VITAMIN D) 1000 UNITS tablet Take 1,000 Units by mouth daily.    Marland Kitchen. FLUoxetine (PROZAC) 20 MG capsule Take 1 capsule (  20 mg total) by mouth daily. 90 capsule 3  . gabapentin (NEURONTIN) 300 MG capsule Take 1 capsule (300 mg total) by mouth at bedtime. 30 capsule 0  . vitamin B-12 (CYANOCOBALAMIN) 1000 MCG tablet Take 1,000 mcg by mouth daily.     No current facility-administered medications on file prior to visit.    Allergies:  No Known Allergies Medical History:  She has Depression, major, recurrent, in partial remission (HCC); RLS (restless legs syndrome); and Insomnia on their problem list.   Health Maintenance:   Tetanus: will check with doctor Pneumovax: declines Prevnar 13: declines Flu vaccine: declines Zostavax: declines  No LMP  recorded. Patient is premenopausal. Pap: 2 years MGM: 10/2016 DEXA: had in the past, has history of low bones density.  Colonoscopy: 2017 EGD: N/A : Patient Care Team: Lucky Cowboy, MD as PCP - General (Internal Medicine)  Surgical History:  She has no past surgical history on file. Family History:  Herfamily history includes Alcohol abuse in her father; Breast cancer in her mother; Cancer (age of onset: 40) in her mother; Cancer (age of onset: 22) in her maternal grandmother; Diabetes (age of onset: 87) in her father; Heart disease in her maternal grandfather. Social History:  She reports that she has been smoking cigarettes.  She has a 5.00 pack-year smoking history. she has never used smokeless tobacco. She reports that she does not drink alcohol or use drugs.  Review of Systems: Review of Systems  Constitutional: Negative.   HENT: Negative.   Eyes: Negative.   Respiratory: Negative.   Cardiovascular: Negative.   Gastrointestinal: Negative.   Genitourinary: Negative.   Musculoskeletal: Negative.   Skin: Negative.     Physical Exam: Estimated body mass index is 18.95 kg/m as calculated from the following:   Height as of this encounter: 5\' 4"  (1.626 m).   Weight as of this encounter: 110 lb 6.4 oz (50.1 kg). BP 114/72   Pulse 64   Temp (!) 97.3 F (36.3 C)   Resp 16   Ht 5\' 4"  (1.626 m)   Wt 110 lb 6.4 oz (50.1 kg)   SpO2 98%   BMI 18.95 kg/m  General Appearance: Well nourished, in no apparent distress.  Eyes: PERRLA, EOMs, conjunctiva no swelling or erythema, normal fundi and vessels.  Sinuses: No Frontal/maxillary tenderness  ENT/Mouth: Ext aud canals clear, normal light reflex with TMs without erythema, bulging. Good dentition. No erythema, swelling, or exudate on post pharynx. Tonsils not swollen or erythematous. Hearing normal.  Neck: Supple, thyroid normal. No bruits  Respiratory: Respiratory effort normal, BS equal bilaterally without rales, rhonchi,  wheezing or stridor.  Cardio: RRR without murmurs, rubs or gallops. Brisk peripheral pulses without edema.  Chest: symmetric, with normal excursions and percussion.  Breasts: Symmetric, without lumps, nipple discharge, retractions.  Abdomen: Soft, nontender, no guarding, rebound, hernias, masses, or organomegaly.  Lymphatics: Non tender without lymphadenopathy.  Genitourinary: defer Musculoskeletal: Full ROM all peripheral extremities,5/5 strength, and normal gait.  Skin: 2 mm dark nevus, good borders, will monitor. Warm, dry without rashes, lesions, ecchymosis. Neuro: Cranial nerves intact, reflexes equal bilaterally. Normal muscle tone, no cerebellar symptoms. Sensation intact.  Psych: Awake and oriented X 3, normal affect, Insight and Judgment appropriate.   Will monitor right flank mole    EKG: defer  Quentin Mulling 10:18 AM [facility]

## 2017-02-08 LAB — MAGNESIUM: Magnesium: 2 mg/dL (ref 1.5–2.5)

## 2017-02-08 LAB — URINALYSIS, ROUTINE W REFLEX MICROSCOPIC
BACTERIA UA: NONE SEEN /HPF
Bilirubin Urine: NEGATIVE
GLUCOSE, UA: NEGATIVE
HGB URINE DIPSTICK: NEGATIVE
HYALINE CAST: NONE SEEN /LPF
Ketones, ur: NEGATIVE
Nitrite: NEGATIVE
PROTEIN: NEGATIVE
RBC / HPF: NONE SEEN /HPF (ref 0–2)
Specific Gravity, Urine: 1.006 (ref 1.001–1.03)
WBC, UA: NONE SEEN /HPF (ref 0–5)
pH: >=8.5 — AB (ref 5.0–8.0)

## 2017-02-08 LAB — MICROALBUMIN / CREATININE URINE RATIO
CREATININE, URINE: 25 mg/dL (ref 20–275)
Microalb, Ur: <0.2 mg/dL

## 2017-02-08 LAB — TSH: TSH: 1.15 m[IU]/L

## 2017-02-08 LAB — BASIC METABOLIC PANEL WITH GFR
BUN: 10 mg/dL (ref 7–25)
CHLORIDE: 101 mmol/L (ref 98–110)
CO2: 30 mmol/L (ref 20–32)
Calcium: 10.1 mg/dL (ref 8.6–10.2)
Creat: 0.78 mg/dL (ref 0.50–1.10)
GFR, Est African American: 110 mL/min/{1.73_m2} (ref >=60)
GFR, Est Non African American: 95 mL/min/{1.73_m2} (ref >=60)
GLUCOSE: 75 mg/dL (ref 65–99)
Potassium: 5.2 mmol/L (ref 3.5–5.3)
Sodium: 140 mmol/L (ref 135–146)

## 2017-02-08 LAB — LIPID PANEL
CHOLESTEROL: 243 mg/dL — AB (ref <200)
HDL: 109 mg/dL (ref >50)
LDL CHOLESTEROL (CALC): 116 mg/dL — AB
Non-HDL Cholesterol (Calc): 134 mg/dL (calc) — ABNORMAL HIGH (ref <130)
TRIGLYCERIDES: 84 mg/dL (ref <150)
Total CHOL/HDL Ratio: 2.2 (calc) (ref <5.0)

## 2017-02-08 LAB — CBC WITH DIFFERENTIAL/PLATELET
BASOS ABS: 50 {cells}/uL (ref 0–200)
Basophils Relative: 0.7 %
EOS ABS: 230 {cells}/uL (ref 15–500)
Eosinophils Relative: 3.2 %
HCT: 40.7 % (ref 35.0–45.0)
Hemoglobin: 14.2 g/dL (ref 11.7–15.5)
Lymphs Abs: 2290 cells/uL (ref 850–3900)
MCH: 30.6 pg (ref 27.0–33.0)
MCHC: 34.9 g/dL (ref 32.0–36.0)
MCV: 87.7 fL (ref 80.0–100.0)
MONOS PCT: 11.7 %
MPV: 10.3 fL (ref 7.5–12.5)
Neutro Abs: 3787 cells/uL (ref 1500–7800)
Neutrophils Relative %: 52.6 %
PLATELETS: 260 10*3/uL (ref 140–400)
RBC: 4.64 10*6/uL (ref 3.80–5.10)
RDW: 13 % (ref 11.0–15.0)
TOTAL LYMPHOCYTE: 31.8 %
WBC mixed population: 842 cells/uL (ref 200–950)
WBC: 7.2 10*3/uL (ref 3.8–10.8)

## 2017-02-08 LAB — VITAMIN D 25 HYDROXY (VIT D DEFICIENCY, FRACTURES): VIT D 25 HYDROXY: 53 ng/mL (ref 30–100)

## 2017-02-08 LAB — HEPATIC FUNCTION PANEL
AG RATIO: 2.1 (calc) (ref 1.0–2.5)
ALT: 17 U/L (ref 6–29)
AST: 19 U/L (ref 10–30)
Albumin: 4.9 g/dL (ref 3.6–5.1)
Alkaline phosphatase (APISO): 93 U/L (ref 33–115)
BILIRUBIN INDIRECT: 0.5 mg/dL (ref 0.2–1.2)
Bilirubin, Direct: 0.1 mg/dL (ref 0.0–0.2)
Globulin: 2.3 g/dL (calc) (ref 1.9–3.7)
TOTAL PROTEIN: 7.2 g/dL (ref 6.1–8.1)
Total Bilirubin: 0.6 mg/dL (ref 0.2–1.2)

## 2017-03-06 ENCOUNTER — Other Ambulatory Visit: Payer: BLUE CROSS/BLUE SHIELD

## 2017-03-21 ENCOUNTER — Ambulatory Visit
Admission: RE | Admit: 2017-03-21 | Discharge: 2017-03-21 | Disposition: A | Discharge status: Home / Self Care | Payer: BLUE CROSS/BLUE SHIELD | Source: Ambulatory Visit | Attending: Physician Assistant | Admitting: Physician Assistant

## 2017-03-21 ENCOUNTER — Encounter: Payer: Self-pay | Admitting: Physician Assistant

## 2017-03-21 DIAGNOSIS — M8589 Other specified disorders of bone density and structure, multiple sites: Secondary | ICD-10-CM | POA: Diagnosis not present

## 2017-03-21 DIAGNOSIS — Z780 Asymptomatic menopausal state: Secondary | ICD-10-CM | POA: Diagnosis not present

## 2017-03-21 DIAGNOSIS — M8580 Other specified disorders of bone density and structure, unspecified site: Secondary | ICD-10-CM | POA: Insufficient documentation

## 2017-04-28 ENCOUNTER — Other Ambulatory Visit: Payer: Self-pay | Admitting: Physician Assistant

## 2017-04-28 DIAGNOSIS — G4700 Insomnia, unspecified: Secondary | ICD-10-CM

## 2017-04-28 DIAGNOSIS — G2581 Restless legs syndrome: Secondary | ICD-10-CM

## 2017-04-28 MED ORDER — GABAPENTIN 300 MG PO CAPS: 600.0000 mg | ORAL_CAPSULE | Freq: Every day | ORAL | 1 refills | Status: DC

## 2017-06-18 ENCOUNTER — Other Ambulatory Visit: Payer: Self-pay | Admitting: Physician Assistant

## 2017-06-18 DIAGNOSIS — F3341 Major depressive disorder, recurrent, in partial remission: Secondary | ICD-10-CM

## 2017-06-18 DIAGNOSIS — G4700 Insomnia, unspecified: Secondary | ICD-10-CM

## 2017-06-18 DIAGNOSIS — G2581 Restless legs syndrome: Secondary | ICD-10-CM

## 2017-06-18 MED ORDER — ALPRAZOLAM 1 MG PO TABS: ORAL_TABLET | ORAL | 1 refills | Status: DC

## 2017-09-01 ENCOUNTER — Other Ambulatory Visit: Payer: Self-pay | Admitting: Physician Assistant

## 2017-09-01 DIAGNOSIS — F3341 Major depressive disorder, recurrent, in partial remission: Secondary | ICD-10-CM

## 2017-09-01 DIAGNOSIS — G2581 Restless legs syndrome: Secondary | ICD-10-CM

## 2017-09-01 DIAGNOSIS — G4700 Insomnia, unspecified: Secondary | ICD-10-CM

## 2017-09-01 MED ORDER — ALPRAZOLAM 1 MG PO TABS: ORAL_TABLET | ORAL | 1 refills | Status: DC

## 2017-09-16 NOTE — Progress Notes (Signed)
Assessment and Plan:  Depression, major, recurrent, in partial remission (HCC) See fatigue  RLS (restless legs syndrome) Continue meds  Insomnia, unspecified type Continue meds for now, will discuss after adjusting SSRI  Fatigue, unspecified type - Discussed lifestyle modification as means of resolving problem, Training modifications discussed, See orders for lab evaluation, Discussed how depression can be a cause of fatigue if all labs are negative will discuss depression treatment.  Will start on lexapro -     CBC with Differential/Platelet -     COMPLETE METABOLIC PANEL WITH GFR -     TSH -     VITAMIN D 25 Hydroxy (Vit-D Deficiency, Fractures) -     Iron,Total/Total Iron Binding Cap -     Vitamin B12 -     escitalopram (LEXAPRO) 20 MG tablet; Take 1 tablet (20 mg total) by mouth daily.  Cough Patient is smoker, cough likely allergies, no red flag symptoms but will get baseline CXR  HPI 42 y.o.female presents for depression/anxiety and fatigue.  She states that she has been very stressed with going through with an adoption, she is suppose to have a daughter in Dec which is very excited but also very stressful and financially has been stressful. She is on prozac has not been on anything else, mom is on celexa. She states she gets tired very easily, if she goes golfing or shopping she will need to nap afterwards, her husbands states she is more irritable. She is taking xanax 1 mg to sleep at night and she will occ take 1/2 during the day. She is on gabapenin 1 at night for sleep, still not sleeping well.  She had a HA every day for 4 days straight last week with nausea, did take an imitrex that helped.  She has been having diarrhea for a month and a half, no blood some mucus in the stool. No Ab pain or cramping. That has improved with diet improving. She admit to eating poorly due to depression.  No joint pain. She has not been exercising.   She has had dry cough, has done  flonase but not allergy pill. No SOB, she has had sweats day and night likely from menopause.  She is up to date on her MGM and PAP.   No past medical history on file.   No Known Allergies  Current Outpatient Medications on File Prior to Visit  Medication Sig  . ALPRAZolam (XANAX) 1 MG tablet 1/2-1 tablet at night for sleep  . cholecalciferol (VITAMIN D) 1000 UNITS tablet Take 1,000 Units by mouth daily.  Marland Kitchen. conjugated estrogens (PREMARIN) vaginal cream Place 1 Applicatorful vaginally daily.  Marland Kitchen. FLUoxetine (PROZAC) 20 MG capsule Take 1 capsule (20 mg total) by mouth daily.  Marland Kitchen. gabapentin (NEURONTIN) 300 MG capsule Take 2 capsules (600 mg total) by mouth at bedtime.  . SUMAtriptan (IMITREX) 100 MG tablet Take 1 tablet (100 mg total) by mouth once as needed for migraine. May repeat in 2 hours if headache persists or recurs.  . vitamin B-12 (CYANOCOBALAMIN) 1000 MCG tablet Take 1,000 mcg by mouth daily.   No current facility-administered medications on file prior to visit.     ROS: all negative except above.   Physical Exam: Filed Weights   09/17/17 1602  Weight: 121 lb 12.8 oz (55.2 kg)   BP 114/62   Pulse 71   Temp 97.6 F (36.4 C)   Resp 18   Wt 121 lb 12.8 oz (55.2 kg)   SpO2  99%   BMI 20.91 kg/m  General Appearance: Well nourished, in no apparent distress. Eyes: PERRLA, EOMs, conjunctiva no swelling or erythema Sinuses: No Frontal/maxillary tenderness ENT/Mouth: Ext aud canals clear, TMs without erythema, bulging. No erythema, swelling, or exudate on post pharynx.  Tonsils not swollen or erythematous. Hearing normal.  Neck: Supple, thyroid normal.  Respiratory: Respiratory effort normal, BS equal bilaterally without rales, rhonchi, wheezing or stridor.  Cardio: RRR with no MRGs. Brisk peripheral pulses without edema.  Abdomen: Soft, + BS.  Non tender, no guarding, rebound, hernias, masses. Lymphatics: Non tender without lymphadenopathy.  Musculoskeletal: Full ROM, 5/5  strength, normal gait.  Skin: Warm, dry without rashes, lesions, ecchymosis.  Neuro: Cranial nerves intact. Normal muscle tone, no cerebellar symptoms. Sensation intact.  Psych: Awake and oriented X 3, normal affect, Insight and Judgment appropriate.     Quentin MullingAmanda Mohmmad Saleeby, PA-C 4:12 PM [facility]

## 2017-09-17 ENCOUNTER — Ambulatory Visit: Payer: BLUE CROSS/BLUE SHIELD | Admitting: Physician Assistant

## 2017-09-17 ENCOUNTER — Ambulatory Visit: Payer: Self-pay | Admitting: Physician Assistant

## 2017-09-17 VITALS — BP 114/62 | HR 71 | Temp 97.6°F | Resp 18 | Wt 121.8 lb

## 2017-09-17 DIAGNOSIS — R05 Cough: Secondary | ICD-10-CM

## 2017-09-17 DIAGNOSIS — R059 Cough, unspecified: Secondary | ICD-10-CM

## 2017-09-17 DIAGNOSIS — R5383 Other fatigue: Secondary | ICD-10-CM

## 2017-09-17 DIAGNOSIS — G2581 Restless legs syndrome: Secondary | ICD-10-CM | POA: Diagnosis not present

## 2017-09-17 DIAGNOSIS — F3341 Major depressive disorder, recurrent, in partial remission: Secondary | ICD-10-CM | POA: Diagnosis not present

## 2017-09-17 DIAGNOSIS — E559 Vitamin D deficiency, unspecified: Secondary | ICD-10-CM

## 2017-09-17 DIAGNOSIS — G4700 Insomnia, unspecified: Secondary | ICD-10-CM | POA: Diagnosis not present

## 2017-09-17 MED ORDER — ESCITALOPRAM OXALATE 20 MG PO TABS: 20.0000 mg | ORAL_TABLET | Freq: Every day | ORAL | 2 refills | Status: DC

## 2017-09-17 NOTE — Patient Instructions (Addendum)
Please stop the prozac and start the lexapro, start on 1/2 a dose or 10 mg morning or night.  Continue your night time medications Do this for 4 weeks and we will see how you are doing, we can increase the lexapro to 20 mg and we can adjust your night time medications but I do not want to do this all at the same time.   We are starting you on a new medication. Here is some general information.   1) Medications are not always the solution, any medication we put you on there is always a hope to come off of it depending on the medication. For example, If we start you on a hypertension medication, I would love to get you off of it and we can address that every visit if you wish. I'm always willing to try to get you off a medication unless I really feel that it is beneficial for you.   2) With what I mentioned above, there is no magic pill, I need you to put in the work to get off any medication you wish to not be on. So things to help is move a little each day, drink plenty of water, eat veggies/fruit, and don't smoke.   3) Every medication has a potential for a side effect. Even over the counter medications have a potential side effect. So I start you on a medication and there is something different over the next 1-3 months let me know. It is always possible that it can be the medication.   [facility] go to radiology and give them your name. They will have the order and take you back. You do not any paper work, I should get the result back today or tomorrow. This order is good for a year.   Escitalopram tablets What is this medicine? ESCITALOPRAM (es sye TAL oh pram) is used to treat depression and certain types of anxiety. This medicine may be used for other purposes; ask your health care provider or pharmacist if you have questions. COMMON BRAND NAME(S): Lexapro What should I tell my health care provider before I take this medicine? They need to know if you have any of these  conditions: -bipolar disorder or a family history of bipolar disorder -diabetes -glaucoma -heart disease -kidney or liver disease -receiving electroconvulsive therapy -seizures (convulsions) -suicidal thoughts, plans, or attempt by you or a family member -an unusual or allergic reaction to escitalopram, the related drug citalopram, other medicines, foods, dyes, or preservatives -pregnant or trying to become pregnant -breast-feeding How should I use this medicine? Take this medicine by mouth with a glass of water. Follow the directions on the prescription label. You can take it with or without food. If it upsets your stomach, take it with food. Take your medicine at regular intervals. Do not take it more often than directed. Do not stop taking this medicine suddenly except upon the advice of your doctor. Stopping this medicine too quickly may cause serious side effects or your condition may worsen. A special MedGuide will be given to you by the pharmacist with each prescription and refill. Be sure to read this information carefully each time. Talk to your pediatrician regarding the use of this medicine in children. Special care may be needed. Overdosage: If you think you have taken too much of this medicine contact a poison control center or emergency room at once. NOTE: This medicine is only for you. Do not share this medicine with  others. What if I miss a dose? If you miss a dose, take it as soon as you can. If it is almost time for your next dose, take only that dose. Do not take double or extra doses. What may interact with this medicine? Do not take this medicine with any of the following medications: -certain medicines for fungal infections like fluconazole, itraconazole, ketoconazole, posaconazole, voriconazole -cisapride -citalopram -dofetilide -dronedarone -linezolid -MAOIs like Carbex, Eldepryl, Marplan, Nardil, and Parnate -methylene blue (injected into a  vein) -pimozide -thioridazine -ziprasidone This medicine may also interact with the following medications: -alcohol -amphetamines -aspirin and aspirin-like medicines -carbamazepine -certain medicines for depression, anxiety, or psychotic disturbances -certain medicines for migraine headache like almotriptan, eletriptan, frovatriptan, naratriptan, rizatriptan, sumatriptan, zolmitriptan -certain medicines for sleep -certain medicines that treat or prevent blood clots like warfarin, enoxaparin, dalteparin -cimetidine -diuretics -fentanyl -furazolidone -isoniazid -lithium -metoprolol -NSAIDs, medicines for pain and inflammation, like ibuprofen or naproxen -other medicines that prolong the QT interval (cause an abnormal heart rhythm) -procarbazine -rasagiline -supplements like St. John's wort, kava kava, valerian -tramadol -tryptophan This list may not describe all possible interactions. Give your health care provider a list of all the medicines, herbs, non-prescription drugs, or dietary supplements you use. Also tell them if you smoke, drink alcohol, or use illegal drugs. Some items may interact with your medicine. What should I watch for while using this medicine? Tell your doctor if your symptoms do not get better or if they get worse. Visit your doctor or health care professional for regular checks on your progress. Because it may take several weeks to see the full effects of this medicine, it is important to continue your treatment as prescribed by your doctor. Patients and their families should watch out for new or worsening thoughts of suicide or depression. Also watch out for sudden changes in feelings such as feeling anxious, agitated, panicky, irritable, hostile, aggressive, impulsive, severely restless, overly excited and hyperactive, or not being able to sleep. If this happens, especially at the beginning of treatment or after a change in dose, call your health care  professional. Bonita Quin may get drowsy or dizzy. Do not drive, use machinery, or do anything that needs mental alertness until you know how this medicine affects you. Do not stand or sit up quickly, especially if you are an older patient. This reduces the risk of dizzy or fainting spells. Alcohol may interfere with the effect of this medicine. Avoid alcoholic drinks. Your mouth may get dry. Chewing sugarless gum or sucking hard candy, and drinking plenty of water may help. Contact your doctor if the problem does not go away or is severe. What side effects may I notice from receiving this medicine? Side effects that you should report to your doctor or health care professional as soon as possible: -allergic reactions like skin rash, itching or hives, swelling of the face, lips, or tongue -anxious -black, tarry stools -changes in vision -confusion -elevated mood, decreased need for sleep, racing thoughts, impulsive behavior -eye pain -fast, irregular heartbeat -feeling faint or lightheaded, falls -feeling agitated, angry, or irritable -hallucination, loss of contact with reality -loss of balance or coordination -loss of memory -painful or prolonged erections -restlessness, pacing, inability to keep still -seizures -stiff muscles -suicidal thoughts or other mood changes -trouble sleeping -unusual bleeding or bruising -unusually weak or tired -vomiting Side effects that usually do not require medical attention (report to your doctor or health care professional if they continue or are bothersome): -changes in appetite -change in  sex drive or performance -headache -increased sweating -indigestion, nausea -tremors This list may not describe all possible side effects. Call your doctor for medical advice about side effects. You may report side effects to FDA at 1-800-FDA-1088. Where should I keep my medicine? Keep out of reach of children. Store at room temperature between 15 and 30 degrees C  (59 and 86 degrees F). Throw away any unused medicine after the expiration date. NOTE: This sheet is a summary. It may not cover all possible information. If you have questions about this medicine, talk to your doctor, pharmacist, or health care provider.  2018 Elsevier/Gold Standard (2015-06-26 13:20:23)   Here is some information below about your new medication.  If you have any concerns or questions please contact the office and not Dr. Waverly FerrariGoogle. =) Remember also that during a study ANY symptoms someone has can be listed as a side effect even if it was not caused by the medication.

## 2017-09-18 LAB — CBC WITH DIFFERENTIAL/PLATELET
BASOS PCT: 0.6 %
Basophils Absolute: 47 cells/uL (ref 0–200)
EOS ABS: 300 {cells}/uL (ref 15–500)
Eosinophils Relative: 3.8 %
HCT: 37.8 % (ref 35.0–45.0)
Hemoglobin: 13 g/dL (ref 11.7–15.5)
Lymphs Abs: 2291 cells/uL (ref 850–3900)
MCH: 29.8 pg (ref 27.0–33.0)
MCHC: 34.4 g/dL (ref 32.0–36.0)
MCV: 86.7 fL (ref 80.0–100.0)
MONOS PCT: 10.6 %
MPV: 10.6 fL (ref 7.5–12.5)
NEUTROS PCT: 56 %
Neutro Abs: 4424 cells/uL (ref 1500–7800)
PLATELETS: 230 10*3/uL (ref 140–400)
RBC: 4.36 10*6/uL (ref 3.80–5.10)
RDW: 12.8 % (ref 11.0–15.0)
TOTAL LYMPHOCYTE: 29 %
WBC mixed population: 837 cells/uL (ref 200–950)
WBC: 7.9 10*3/uL (ref 3.8–10.8)

## 2017-09-18 LAB — COMPLETE METABOLIC PANEL WITH GFR
AG Ratio: 2.1 (calc) (ref 1.0–2.5)
ALT: 16 U/L (ref 6–29)
AST: 22 U/L (ref 10–30)
Albumin: 4.5 g/dL (ref 3.6–5.1)
Alkaline phosphatase (APISO): 100 U/L (ref 33–115)
BILIRUBIN TOTAL: 0.3 mg/dL (ref 0.2–1.2)
BUN: 14 mg/dL (ref 7–25)
CHLORIDE: 101 mmol/L (ref 98–110)
CO2: 30 mmol/L (ref 20–32)
Calcium: 9.6 mg/dL (ref 8.6–10.2)
Creat: 0.8 mg/dL (ref 0.50–1.10)
GFR, Est African American: 106 mL/min/{1.73_m2} (ref >=60)
GFR, Est Non African American: 92 mL/min/{1.73_m2} (ref >=60)
GLUCOSE: 90 mg/dL (ref 65–99)
Globulin: 2.1 g/dL (calc) (ref 1.9–3.7)
Potassium: 4.4 mmol/L (ref 3.5–5.3)
Sodium: 139 mmol/L (ref 135–146)
Total Protein: 6.6 g/dL (ref 6.1–8.1)

## 2017-09-18 LAB — VITAMIN D 25 HYDROXY (VIT D DEFICIENCY, FRACTURES): Vit D, 25-Hydroxy: 61 ng/mL (ref 30–100)

## 2017-09-18 LAB — IRON, TOTAL/TOTAL IRON BINDING CAP
%SAT: 23 % (calc) (ref 16–45)
IRON: 76 ug/dL (ref 40–190)
TIBC: 334 ug/dL (ref 250–450)

## 2017-09-18 LAB — TSH: TSH: 0.78 mIU/L

## 2017-09-18 LAB — VITAMIN B12: Vitamin B-12: >2000 pg/mL — ABNORMAL HIGH (ref 200–1100)

## 2017-09-22 ENCOUNTER — Ambulatory Visit: Payer: Self-pay | Admitting: Physician Assistant

## 2017-10-10 ENCOUNTER — Other Ambulatory Visit: Payer: Self-pay | Admitting: Physician Assistant

## 2017-10-14 ENCOUNTER — Other Ambulatory Visit: Payer: Self-pay | Admitting: Physician Assistant

## 2017-10-14 ENCOUNTER — Ambulatory Visit (HOSPITAL_COMMUNITY)
Admission: RE | Admit: 2017-10-14 | Discharge: 2017-10-14 | Disposition: A | Discharge status: Home / Self Care | Payer: BLUE CROSS/BLUE SHIELD | Source: Ambulatory Visit | Attending: Physician Assistant | Admitting: Physician Assistant

## 2017-10-14 DIAGNOSIS — J449 Chronic obstructive pulmonary disease, unspecified: Secondary | ICD-10-CM | POA: Insufficient documentation

## 2017-10-14 DIAGNOSIS — R05 Cough: Secondary | ICD-10-CM | POA: Insufficient documentation

## 2017-10-14 DIAGNOSIS — R059 Cough, unspecified: Secondary | ICD-10-CM

## 2017-10-14 MED ORDER — PROMETHAZINE-DM 6.25-15 MG/5ML PO SYRP: 5.0000 mL | ORAL_SOLUTION | Freq: Four times a day (QID) | ORAL | 1 refills | Status: DC | PRN

## 2017-10-14 MED ORDER — PREDNISONE 20 MG PO TABS: ORAL_TABLET | ORAL | 0 refills | Status: DC

## 2017-10-14 MED ORDER — AZITHROMYCIN 250 MG PO TABS: ORAL_TABLET | ORAL | 1 refills | Status: AC

## 2017-10-22 ENCOUNTER — Other Ambulatory Visit: Payer: Self-pay | Admitting: Physician Assistant

## 2017-10-22 DIAGNOSIS — G4700 Insomnia, unspecified: Secondary | ICD-10-CM

## 2017-10-22 DIAGNOSIS — G2581 Restless legs syndrome: Secondary | ICD-10-CM

## 2017-10-31 ENCOUNTER — Other Ambulatory Visit: Payer: Self-pay | Admitting: Physician Assistant

## 2017-10-31 DIAGNOSIS — J449 Chronic obstructive pulmonary disease, unspecified: Secondary | ICD-10-CM | POA: Insufficient documentation

## 2017-11-02 ENCOUNTER — Other Ambulatory Visit: Payer: Self-pay | Admitting: Physician Assistant

## 2017-11-02 DIAGNOSIS — F3341 Major depressive disorder, recurrent, in partial remission: Secondary | ICD-10-CM

## 2017-11-02 DIAGNOSIS — G4700 Insomnia, unspecified: Secondary | ICD-10-CM

## 2017-11-02 DIAGNOSIS — G2581 Restless legs syndrome: Secondary | ICD-10-CM

## 2017-11-02 MED ORDER — ALPRAZOLAM 1 MG PO TABS: ORAL_TABLET | ORAL | 1 refills | Status: DC

## 2017-11-21 ENCOUNTER — Other Ambulatory Visit: Payer: Self-pay | Admitting: Physician Assistant

## 2017-11-21 ENCOUNTER — Ambulatory Visit (INDEPENDENT_AMBULATORY_CARE_PROVIDER_SITE_OTHER): Payer: BLUE CROSS/BLUE SHIELD | Admitting: Internal Medicine

## 2017-11-21 DIAGNOSIS — J449 Chronic obstructive pulmonary disease, unspecified: Secondary | ICD-10-CM | POA: Diagnosis not present

## 2017-11-21 LAB — PULMONARY FUNCTION TEST
DL/VA % pred: 104 %
DL/VA: 5 ml/min/mmHg/L
DLCO UNC % PRED: 91 %
DLCO UNC: 22.1 ml/min/mmHg
FEF 25-75 PRE: 1.77 L/s
FEF 25-75 Post: 1.82 L/sec
FEF2575-%Change-Post: 2 %
FEF2575-%PRED-POST: 58 %
FEF2575-%Pred-Pre: 57 %
FEV1-%Change-Post: 0 %
FEV1-%PRED-POST: 76 %
FEV1-%Pred-Pre: 75 %
FEV1-POST: 2.29 L
FEV1-Pre: 2.27 L
FEV1FVC-%Change-Post: 6 %
FEV1FVC-%Pred-Pre: 90 %
FEV6-%CHANGE-POST: -4 %
FEV6-%PRED-POST: 80 %
FEV6-%Pred-Pre: 84 %
FEV6-PRE: 3.04 L
FEV6-Post: 2.89 L
FEV6FVC-%CHANGE-POST: 0 %
FEV6FVC-%PRED-PRE: 101 %
FEV6FVC-%Pred-Post: 101 %
FVC-%Change-Post: -5 %
FVC-%Pred-Post: 78 %
FVC-%Pred-Pre: 83 %
FVC-Post: 2.91 L
FVC-Pre: 3.06 L
POST FEV6/FVC RATIO: 100 %
Post FEV1/FVC ratio: 79 %
Pre FEV1/FVC ratio: 74 %
Pre FEV6/FVC Ratio: 99 %
RV % PRED: 104 %
RV: 1.68 L
TLC % pred: 90 %
TLC: 4.59 L

## 2017-11-21 MED ORDER — BUPROPION HCL ER (XL) 150 MG PO TB24: 150.0000 mg | ORAL_TABLET | ORAL | 2 refills | Status: DC

## 2017-11-21 NOTE — Progress Notes (Signed)
PFT done today. 

## 2017-12-17 ENCOUNTER — Other Ambulatory Visit: Payer: Self-pay | Admitting: Physician Assistant

## 2017-12-22 ENCOUNTER — Telehealth: Payer: Self-pay | Admitting: Physician Assistant

## 2017-12-22 NOTE — Telephone Encounter (Signed)
Patient is due for an appt. In order to continue prescribing medication. I tried to contact the patient and the only number on file for her is not working.-E Webb SilversmithWelch

## 2017-12-22 NOTE — Telephone Encounter (Signed)
-----   Message from Gregery NaAngela D Duff, CMA sent at 12/18/2017  8:49 AM EST ----- Regarding: med refills Has no future office visit--needs refills thanks

## 2018-01-09 ENCOUNTER — Other Ambulatory Visit: Payer: Self-pay | Admitting: Physician Assistant

## 2018-01-09 DIAGNOSIS — G4700 Insomnia, unspecified: Secondary | ICD-10-CM

## 2018-01-09 DIAGNOSIS — G2581 Restless legs syndrome: Secondary | ICD-10-CM

## 2018-01-09 DIAGNOSIS — F3341 Major depressive disorder, recurrent, in partial remission: Secondary | ICD-10-CM

## 2018-01-09 MED ORDER — ALPRAZOLAM 1 MG PO TABS: ORAL_TABLET | ORAL | 1 refills | Status: DC

## 2018-01-14 ENCOUNTER — Encounter: Payer: Self-pay | Admitting: Physician Assistant

## 2018-01-15 ENCOUNTER — Other Ambulatory Visit: Payer: Self-pay | Admitting: Physician Assistant

## 2018-01-24 ENCOUNTER — Other Ambulatory Visit: Payer: Self-pay | Admitting: Physician Assistant

## 2018-01-24 DIAGNOSIS — G2581 Restless legs syndrome: Secondary | ICD-10-CM

## 2018-01-24 DIAGNOSIS — G4700 Insomnia, unspecified: Secondary | ICD-10-CM

## 2018-02-05 DIAGNOSIS — S92514A Nondisplaced fracture of proximal phalanx of right lesser toe(s), initial encounter for closed fracture: Secondary | ICD-10-CM | POA: Diagnosis not present

## 2018-02-09 ENCOUNTER — Other Ambulatory Visit: Payer: Self-pay

## 2018-02-09 MED ORDER — BUPROPION HCL ER (XL) 150 MG PO TB24: ORAL_TABLET | ORAL | 1 refills | Status: DC

## 2018-03-08 ENCOUNTER — Other Ambulatory Visit: Payer: Self-pay | Admitting: Physician Assistant

## 2018-03-08 DIAGNOSIS — G4700 Insomnia, unspecified: Secondary | ICD-10-CM

## 2018-03-08 DIAGNOSIS — G2581 Restless legs syndrome: Secondary | ICD-10-CM

## 2018-03-18 DIAGNOSIS — R109 Unspecified abdominal pain: Secondary | ICD-10-CM | POA: Diagnosis not present

## 2018-03-18 DIAGNOSIS — F17210 Nicotine dependence, cigarettes, uncomplicated: Secondary | ICD-10-CM | POA: Diagnosis not present

## 2018-03-18 DIAGNOSIS — E860 Dehydration: Secondary | ICD-10-CM | POA: Diagnosis not present

## 2018-03-18 DIAGNOSIS — K529 Noninfective gastroenteritis and colitis, unspecified: Secondary | ICD-10-CM | POA: Diagnosis not present

## 2018-03-18 DIAGNOSIS — G2581 Restless legs syndrome: Secondary | ICD-10-CM | POA: Diagnosis not present

## 2018-03-18 DIAGNOSIS — R112 Nausea with vomiting, unspecified: Secondary | ICD-10-CM | POA: Diagnosis not present

## 2018-03-18 DIAGNOSIS — Z79899 Other long term (current) drug therapy: Secondary | ICD-10-CM | POA: Diagnosis not present

## 2018-03-18 DIAGNOSIS — R1110 Vomiting, unspecified: Secondary | ICD-10-CM | POA: Diagnosis not present

## 2018-03-18 DIAGNOSIS — R197 Diarrhea, unspecified: Secondary | ICD-10-CM | POA: Diagnosis not present

## 2018-04-07 ENCOUNTER — Other Ambulatory Visit: Payer: Self-pay | Admitting: Physician Assistant

## 2018-04-15 ENCOUNTER — Other Ambulatory Visit: Payer: Self-pay | Admitting: Physician Assistant

## 2018-04-15 DIAGNOSIS — J111 Influenza due to unidentified influenza virus with other respiratory manifestations: Secondary | ICD-10-CM | POA: Diagnosis not present

## 2018-04-15 DIAGNOSIS — J019 Acute sinusitis, unspecified: Secondary | ICD-10-CM | POA: Diagnosis not present

## 2018-04-15 MED ORDER — BENZONATATE 200 MG PO CAPS: 200.0000 mg | ORAL_CAPSULE | Freq: Three times a day (TID) | ORAL | 0 refills | Status: DC | PRN

## 2018-04-15 MED ORDER — ALBUTEROL SULFATE HFA 108 (90 BASE) MCG/ACT IN AERS: 2.0000 | INHALATION_SPRAY | Freq: Four times a day (QID) | RESPIRATORY_TRACT | 1 refills | Status: DC | PRN

## 2018-04-15 MED ORDER — PREDNISONE 20 MG PO TABS: ORAL_TABLET | ORAL | 0 refills | Status: DC

## 2018-04-15 MED ORDER — AZITHROMYCIN 250 MG PO TABS: ORAL_TABLET | ORAL | 1 refills | Status: DC

## 2018-04-17 ENCOUNTER — Other Ambulatory Visit: Payer: Self-pay

## 2018-04-17 MED ORDER — ESCITALOPRAM OXALATE 20 MG PO TABS: 20.0000 mg | ORAL_TABLET | Freq: Every day | ORAL | 1 refills | Status: DC

## 2018-04-18 ENCOUNTER — Other Ambulatory Visit: Payer: Self-pay | Admitting: Physician Assistant

## 2018-04-18 DIAGNOSIS — G4700 Insomnia, unspecified: Secondary | ICD-10-CM

## 2018-04-18 MED ORDER — TRAZODONE HCL 50 MG PO TABS: ORAL_TABLET | ORAL | 2 refills | Status: DC

## 2018-04-18 MED ORDER — TEMAZEPAM 30 MG PO CAPS: 30.0000 mg | ORAL_CAPSULE | Freq: Every evening | ORAL | 0 refills | Status: DC | PRN

## 2018-05-10 ENCOUNTER — Other Ambulatory Visit: Payer: Self-pay | Admitting: Physician Assistant

## 2018-05-10 DIAGNOSIS — G4700 Insomnia, unspecified: Secondary | ICD-10-CM

## 2018-05-22 ENCOUNTER — Ambulatory Visit
Admission: RE | Admit: 2018-05-22 | Discharge: 2018-05-22 | Disposition: A | Discharge status: Home / Self Care | Payer: BLUE CROSS/BLUE SHIELD | Source: Ambulatory Visit | Attending: Physician Assistant | Admitting: Physician Assistant

## 2018-05-22 ENCOUNTER — Other Ambulatory Visit: Payer: Self-pay | Admitting: Physician Assistant

## 2018-05-22 ENCOUNTER — Telehealth: Payer: BLUE CROSS/BLUE SHIELD | Admitting: Physician Assistant

## 2018-05-22 ENCOUNTER — Other Ambulatory Visit: Payer: Self-pay

## 2018-05-22 DIAGNOSIS — M79671 Pain in right foot: Secondary | ICD-10-CM

## 2018-05-22 DIAGNOSIS — S92515A Nondisplaced fracture of proximal phalanx of left lesser toe(s), initial encounter for closed fracture: Secondary | ICD-10-CM | POA: Diagnosis not present

## 2018-05-22 MED ORDER — HYDROCODONE-ACETAMINOPHEN 5-325 MG PO TABS: 1.0000 | ORAL_TABLET | Freq: Three times a day (TID) | ORAL | 0 refills | Status: AC | PRN

## 2018-05-22 NOTE — Telephone Encounter (Signed)
THIS ENCOUNTER IS A VIRTUAL VISIT DUE TO COVID-19 - PATIENT WAS NOT SEEN IN THE OFFICE.  PATIENT HAS CONSENTED TO VIRTUAL VISIT / TELEMEDICINE VISIT   Virtual Visit via telephone Note  I connected with Victoria Ashley on 05/22/2018 05/22/2018  by telephone.  I verified that I am speaking with the correct person using two identifiers.    I discussed the limitations of evaluation and management by telemedicine and the availability of in person appointments. The patient expressed understanding and agreed to proceed.  History of Present Illness: 43 y.o. WF calls with right lateral foot/toe injury. She had a similar injury in Jan, never had xray. She states that on Sunday, she was running and hit her lateral toe/foot on the side of a wooden bed. Immediate pain and swelling. She has had pain with weight bearing. She has lateral foot pain. Has been taking ibuprofen 800mg  TID, tylenol 1000mg  TID, elevating and icing with worsening pain.   Medications    Current Outpatient Medications (Respiratory):  .  albuterol (PROAIR HFA) 108 (90 Base) MCG/ACT inhaler, Inhale 2 puffs into the lungs every 6 (six) hours as needed for wheezing or shortness of breath.  Current Outpatient Medications (Analgesics):  Marland Kitchen  SUMAtriptan (IMITREX) 100 MG tablet, Take 1 tablet (100 mg total) by mouth once as needed for migraine. May repeat in 2 hours if headache persists or recurs.  Current Outpatient Medications (Hematological):  .  vitamin B-12 (CYANOCOBALAMIN) 1000 MCG tablet, Take 1,000 mcg by mouth daily.  Current Outpatient Medications (Other):  .  cholecalciferol (VITAMIN D) 1000 UNITS tablet, Take 1,000 Units by mouth daily. Marland Kitchen  conjugated estrogens (PREMARIN) vaginal cream, Place 1 Applicatorful vaginally daily. Marland Kitchen  escitalopram (LEXAPRO) 20 MG tablet, Take 1 tablet (20 mg total) by mouth daily. Marland Kitchen  gabapentin (NEURONTIN) 300 MG capsule, TAKE 2 CAPSULES (600 MG TOTAL) BY MOUTH AT BEDTIME. .  temazepam (RESTORIL) 30 MG  capsule, Take 1 capsule (30 mg total) by mouth at bedtime as needed for sleep (if trazodone is not effective). .  traZODone (DESYREL) 50 MG tablet, TAKE 1/2-1 TABLET FOR SLEEP, START A LOW DOSE, MAX OF THIS MED IS 150MG  A DAY  Problem list She has Depression, major, recurrent, in partial remission (HCC); RLS (restless legs syndrome); Insomnia; Osteopenia; and COPD (chronic obstructive pulmonary disease) (HCC) on their problem list.   Observations/Objective: General Appearance:Well sounding, in no apparent distress.  ENT/Mouth: No hoarseness, No cough for duration of visit.  Respiratory: completing full sentences without distress, without audible wheeze Neuro: Awake and oriented X 3,  Psych:  Insight and Judgment appropriate.  Right foot with pain on lateral mid foot.   Assessment and Plan: Right foot pain Rule out fracture- will get Xray- make sure not displaced Continue ibuprofen with food Ice, elevate Use crutches for non weight bearing, wear shoe from Jan Will send in short course of norco.   Follow Up Instructions:    I discussed the assessment and treatment plan with the patient. The patient was provided an opportunity to ask questions and all were answered. The patient agreed with the plan and demonstrated an understanding of the instructions.   The patient was advised to call back or seek an in-person evaluation if the symptoms worsen or if the condition fails to improve as anticipated.  I provided 20 minutes of non-face-to-face time during this encounter.   Quentin Mulling, PA-C

## 2018-05-25 ENCOUNTER — Other Ambulatory Visit: Payer: Self-pay | Admitting: Physician Assistant

## 2018-05-25 DIAGNOSIS — S92919A Unspecified fracture of unspecified toe(s), initial encounter for closed fracture: Secondary | ICD-10-CM

## 2018-06-01 DIAGNOSIS — M79674 Pain in right toe(s): Secondary | ICD-10-CM | POA: Diagnosis not present

## 2018-06-03 ENCOUNTER — Other Ambulatory Visit: Payer: Self-pay

## 2018-06-03 ENCOUNTER — Ambulatory Visit: Payer: BLUE CROSS/BLUE SHIELD | Admitting: Physician Assistant

## 2018-06-03 DIAGNOSIS — R232 Flushing: Secondary | ICD-10-CM

## 2018-06-03 MED ORDER — VENLAFAXINE HCL ER 37.5 MG PO CP24: 37.5000 mg | ORAL_CAPSULE | Freq: Every day | ORAL | 2 refills | Status: DC

## 2018-06-03 NOTE — Progress Notes (Signed)
THIS ENCOUNTER IS A VIRTUAL/TELEPHONE VISIT DUE TO COVID-19 - PATIENT WAS NOT SEEN IN THE OFFICE.  PATIENT HAS CONSENTED TO VIRTUAL VISIT / TELEMEDICINE VISIT  This provider placed a call to [employer] using telephone, her appointment was changed to a virtual office visit to reduce the risk of exposure to the COVID-19 virus and to help Victoria Ashley remain healthy and safe. The virtual visit will also provide continuity of care. She verbalizes understanding.    Subjective:    Patient ID: Victoria Ashley, female    DOB: [birth year], 43 y.o.   MRN: [id number]  HPI 43 y.o. WF s/p early menopause with history of empty egg syndrome with strong family history of breast cancer/ovarian cancer having hot flashes worsening x 2 weeks. She is having hot flashes day and night, however she is changing her clothes 2-3 x a night.   She is on lexapro 20mg  she does take daily,  gabapentin 300mg  at night which she is not taking, restoril 30mg .   She is overdue for a MGM, last MGM 10/2016 She had a CXR - last CXR 10/2017  There were no vitals taken for this visit.  Review of Systems  Constitutional: Positive for diaphoresis. Negative for activity change, appetite change, chills, fatigue, fever and unexpected weight change.  HENT: Negative.   Respiratory: Negative.   Cardiovascular: Negative.   Gastrointestinal: Negative.   Genitourinary: Negative.   Musculoskeletal: Negative.   Skin: Negative.        Objective:   Physical Exam General Appearance:Well sounding, in no apparent distress.  ENT/Mouth: No hoarseness, No cough for duration of visit.  Respiratory: completing full sentences without distress, without audible wheeze Neuro: Awake and oriented X 3,  Psych:  Insight and Judgment appropriate.       Assessment & Plan:  Diagnoses and all orders for this visit:  Hot flashes -     venlafaxine XR (EFFEXOR XR) 37.5 MG 24 hr capsule; Take 1 capsule (37.5 mg total) by mouth daily. Will cut lexapro in  half and add low dose of effexor.  Get MGM, she is overdue Quit smoking  Between 15-20 minutes of counseling, chart review, and critical decision making was performed

## 2018-06-03 NOTE — Patient Instructions (Addendum)
Cut back to lexapro to 1/2 pill or 10 mg Add on effexor/venlavaxine 37.5 mg daily for 7 days Then can do 2 pills a day after that  Can take gabapentin at night IF needed for hot flashes, up to 1-3 hours before bed.   Please be aware that some of the medications that you are on can sometimes cause a rare and potentially dangerous adverse reaction, called SEROTONIN SYNDROME: Symptoms of this condition include (but are not limited to):  Agitation or restlessness, confusion, rapid heart rate and high blood pressure, dilated pupils, loss of muscle coordination or twitching muscles, muscle rigidity/stiffness, sweating and/or flushing, diarrhea, headache, shivering, goose bumps. If you have any of these symptoms you may have to stop the medication. Call your health care provider immediately.  Severe serotonin syndrome can be life-threatening emergency. Signs and symptoms of a severe reaction may include: high fever, seizures, irregular heartbeat, unconsciousness or altered level of awareness or personality changes.  If you have any of these new symptoms, call 911 or have someone take you to the emergency room.   HOW TO SCHEDULE A MAMMOGRAM  [facility]  7 a.m.-6:30 p.m., Monday 7 a.m.-5 p.m., Tuesday-Friday Schedule an appointment by calling [phone]. Schedule online  [address] Dr. Tenny Craw, Dr. Henderson Cloud  [facility] Dr. Renaldo Fiddler, Dr. Vincente Poli

## 2018-06-28 ENCOUNTER — Other Ambulatory Visit: Payer: Self-pay | Admitting: Physician Assistant

## 2018-06-28 DIAGNOSIS — R232 Flushing: Secondary | ICD-10-CM

## 2018-07-07 ENCOUNTER — Other Ambulatory Visit: Payer: Self-pay | Admitting: Physician Assistant

## 2018-07-07 ENCOUNTER — Other Ambulatory Visit: Payer: Self-pay

## 2018-07-07 DIAGNOSIS — G2581 Restless legs syndrome: Secondary | ICD-10-CM

## 2018-07-07 DIAGNOSIS — G4700 Insomnia, unspecified: Secondary | ICD-10-CM

## 2018-07-07 DIAGNOSIS — Z1231 Encounter for screening mammogram for malignant neoplasm of breast: Secondary | ICD-10-CM

## 2018-07-07 MED ORDER — GABAPENTIN 300 MG PO CAPS: 600.0000 mg | ORAL_CAPSULE | Freq: Every day | ORAL | 0 refills | Status: DC

## 2018-07-20 ENCOUNTER — Other Ambulatory Visit: Payer: Self-pay | Admitting: Physician Assistant

## 2018-07-20 DIAGNOSIS — G4700 Insomnia, unspecified: Secondary | ICD-10-CM

## 2018-08-03 ENCOUNTER — Other Ambulatory Visit: Payer: Self-pay | Admitting: Physician Assistant

## 2018-08-03 DIAGNOSIS — G4700 Insomnia, unspecified: Secondary | ICD-10-CM

## 2018-08-04 MED ORDER — TRAZODONE HCL 50 MG PO TABS: ORAL_TABLET | ORAL | 1 refills | Status: DC

## 2018-08-04 NOTE — Addendum Note (Signed)
Addended by: Vladimir Crofts on: 08/04/2018 08:38 AM   Modules accepted: Orders

## 2018-08-27 ENCOUNTER — Ambulatory Visit: Payer: BLUE CROSS/BLUE SHIELD

## 2018-09-30 ENCOUNTER — Other Ambulatory Visit: Payer: Self-pay | Admitting: Internal Medicine

## 2018-09-30 DIAGNOSIS — R232 Flushing: Secondary | ICD-10-CM

## 2018-10-02 DIAGNOSIS — G43909 Migraine, unspecified, not intractable, without status migrainosus: Secondary | ICD-10-CM | POA: Insufficient documentation

## 2018-10-02 DIAGNOSIS — E559 Vitamin D deficiency, unspecified: Secondary | ICD-10-CM | POA: Insufficient documentation

## 2018-10-02 NOTE — Progress Notes (Deleted)
Complete Physical  Assessment and Plan:  Depression, major, recurrent, in partial remission (HCC) Check dose of prozac at home Can increase to 40mg  if that is what she is on  RLS (restless legs syndrome) -     gabapentin (NEURONTIN) 300 MG capsule; Take 1 capsule (300 mg total) by mouth at bedtime. - get back to working out  Insomnia, unspecified type -     gabapentin (NEURONTIN) 300 MG capsule; Take 1 capsule (300 mg total) by mouth at bedtime. Can do xanax PRN for sleep but doing well on the Neurontin.   Migraine without status migrainosus, not intractable, unspecified migraine type -     SUMAtriptan (IMITREX) 100 MG tablet; Take 1 tablet (100 mg total) by mouth once as needed for migraine. May repeat in 2 hours if headache persists or recurs. - adding gabapentin might help - if any neuro symptoms   Vaginal atrophy -     conjugated estrogens (PREMARIN) vaginal cream; Place 1 Applicatorful vaginally daily.  Osteopenia, unspecified location -     DG Bone Density; Future  Screening cholesterol level -     Lipid panel  Vitamin D deficiency -     VITAMIN D 25 Hydroxy (Vit-D Deficiency, Fractures)  Medication management -     CBC with Differential/Platelet -     BASIC METABOLIC PANEL WITH GFR -     Hepatic function panel -     Magnesium  Screening for hematuria or proteinuria -     Urinalysis, Routine w reflex microscopic -     Microalbumin / creatinine urine ratio  Screening for thyroid disorder -     TSH    Discussed med's effects and SE's. Screening labs and tests as requested with regular follow-up as recommended. Over 40 minutes of exam, counseling, chart review, and complex, high level critical decision making was performed this visit.   HPI  43 y.o. female  presents for a complete physical and follow up for has Depression, major, recurrent, in partial remission (HCC); RLS (restless legs syndrome); Insomnia; Osteopenia; and COPD (chronic obstructive pulmonary  disease) (HCC) on their problem list..  Her blood pressure has been controlled at home, today their BP is   She does workout. She denies chest pain, shortness of breath, dizziness.   She is on prozac 40mg  for hot flashes and depression. She was taking temazepam 30mg  for sleep but was hoping to try something that was not habit forming. Just start on gabapentin 300mg  and she is doing well with it, helping decreasing night sweats.  She does have vaginal dryness, has tried OTC that does not help.  She has empty egg syndrome and early menopause, strong family history of breast cancer and grandmother with ovarian cancer. Had normal MGM last OV.   She is on vitamin D and B12 supplement.   Has history of migraines but has not had for 5-6 years except had one in [place] for first time, had typical HA symptoms, nausea, vomiting. Requesting acute medication in case.   BMI is There is no height or weight on file to calculate BMI., she is working on diet and exercise. Wt Readings from Last 3 Encounters:  09/17/17 121 lb 12.8 oz (55.2 kg)  02/07/17 110 lb 6.4 oz (50.1 kg)  09/18/16 107 lb 3.2 oz (48.6 kg)     Current Medications:  Current Outpatient Medications on File Prior to Visit  Medication Sig Dispense Refill   albuterol (PROAIR HFA) 108 (90 Base) MCG/ACT inhaler Inhale 2  puffs into the lungs every 6 (six) hours as needed for wheezing or shortness of breath. 8 g 1   cholecalciferol (VITAMIN D) 1000 UNITS tablet Take 1,000 Units by mouth daily.     conjugated estrogens (PREMARIN) vaginal cream Place 1 Applicatorful vaginally daily. 42.5 g 12   escitalopram (LEXAPRO) 20 MG tablet Take 1 tablet (20 mg total) by mouth daily. 90 tablet 1   gabapentin (NEURONTIN) 300 MG capsule Take 2 capsules (600 mg total) by mouth at bedtime. 180 capsule 0   SUMAtriptan (IMITREX) 100 MG tablet Take 1 tablet (100 mg total) by mouth once as needed for migraine. May repeat in 2 hours if headache persists or recurs.  10 tablet 1   temazepam (RESTORIL) 30 MG capsule Take 1 capsule (30 mg total) by mouth at bedtime as needed for sleep (if trazodone is not effective). 30 capsule 0   traZODone (DESYREL) 50 MG tablet TAKE 1/2-1 TABLET FOR SLEEP, START A LOW DOSE, MAX OF THIS MED IS 150MG  A DAY 270 tablet 1   venlafaxine XR (EFFEXOR-XR) 37.5 MG 24 hr capsule TAKE 1 CAPSULE DAILY FOR MOOD 90 capsule 0   vitamin B-12 (CYANOCOBALAMIN) 1000 MCG tablet Take 1,000 mcg by mouth daily.     No current facility-administered medications on file prior to visit.    Allergies:  No Known Allergies Medical History:  She has Depression, major, recurrent, in partial remission (HCC); RLS (restless legs syndrome); Insomnia; Osteopenia; and COPD (chronic obstructive pulmonary disease) (HCC) on their problem list.   Health Maintenance:   Tetanus: will check with doctor Pneumovax: declines Prevnar 13: declines Flu vaccine: declines Zostavax: declines  No LMP recorded. Patient is premenopausal. Pap: 2 years MGM: 10/2016 DEXA: had in the past, has history of low bones density.  Colonoscopy: 2017 EGD: N/A : Patient Care Team: Lucky Cowboy, MD as PCP - General (Internal Medicine)  Surgical History:  She has no past surgical history on file. Family History:  Herfamily history includes Alcohol abuse in her father; Breast cancer in her mother; Cancer (age of onset: 60) in her mother; Cancer (age of onset: 38) in her maternal grandmother; Diabetes (age of onset: 80) in her father; Heart disease in her maternal grandfather. Social History:  She reports that she has been smoking cigarettes. She has a 5.00 pack-year smoking history. She has never used smokeless tobacco. She reports that she does not drink alcohol or use drugs.  Review of Systems: Review of Systems  Constitutional: Negative.   HENT: Negative.   Eyes: Negative.   Respiratory: Negative.   Cardiovascular: Negative.   Gastrointestinal: Negative.     Genitourinary: Negative.   Musculoskeletal: Negative.   Skin: Negative.     Physical Exam: Estimated body mass index is 20.91 kg/m as calculated from the following:   Height as of 02/07/17: 5\' 4"  (1.626 m).   Weight as of 09/17/17: 121 lb 12.8 oz (55.2 kg). There were no vitals taken for this visit. General Appearance: Well nourished, in no apparent distress.  Eyes: PERRLA, EOMs, conjunctiva no swelling or erythema, normal fundi and vessels.  Sinuses: No Frontal/maxillary tenderness  ENT/Mouth: Ext aud canals clear, normal light reflex with TMs without erythema, bulging. Good dentition. No erythema, swelling, or exudate on post pharynx. Tonsils not swollen or erythematous. Hearing normal.  Neck: Supple, thyroid normal. No bruits  Respiratory: Respiratory effort normal, BS equal bilaterally without rales, rhonchi, wheezing or stridor.  Cardio: RRR without murmurs, rubs or gallops. Brisk peripheral pulses without  edema.  Chest: symmetric, with normal excursions and percussion.  Breasts: Symmetric, without lumps, nipple discharge, retractions.  Abdomen: Soft, nontender, no guarding, rebound, hernias, masses, or organomegaly.  Lymphatics: Non tender without lymphadenopathy.  Genitourinary: defer Musculoskeletal: Full ROM all peripheral extremities,5/5 strength, and normal gait.  Skin: 2 mm dark nevus, good borders, will monitor. Warm, dry without rashes, lesions, ecchymosis. Neuro: Cranial nerves intact, reflexes equal bilaterally. Normal muscle tone, no cerebellar symptoms. Sensation intact.  Psych: Awake and oriented X 3, normal affect, Insight and Judgment appropriate.   Will monitor right flank mole    EKG: defer  Vicie Mutters 9:22 AM [facility]

## 2018-10-05 ENCOUNTER — Encounter: Payer: BLUE CROSS/BLUE SHIELD | Admitting: Physician Assistant

## 2018-10-07 ENCOUNTER — Other Ambulatory Visit: Payer: Self-pay | Admitting: Physician Assistant

## 2018-10-07 DIAGNOSIS — G2581 Restless legs syndrome: Secondary | ICD-10-CM

## 2018-10-07 DIAGNOSIS — G4700 Insomnia, unspecified: Secondary | ICD-10-CM

## 2018-10-14 ENCOUNTER — Other Ambulatory Visit: Payer: Self-pay | Admitting: Physician Assistant

## 2018-10-14 DIAGNOSIS — R232 Flushing: Secondary | ICD-10-CM

## 2018-10-14 MED ORDER — VENLAFAXINE HCL ER 37.5 MG PO CP24: ORAL_CAPSULE | ORAL | 0 refills | Status: DC

## 2018-10-14 NOTE — Progress Notes (Signed)
Will cut back on lexapro to 10mg  for 1 week and add on venlafaxine 37.5mg  daily for 1 week, then stop the lexapro and take 2 of the venlafaxine, will increase to 75mg  pill once a day. Please notify us BEFORE you run out of pills.

## 2018-10-15 ENCOUNTER — Ambulatory Visit (INDEPENDENT_AMBULATORY_CARE_PROVIDER_SITE_OTHER): Payer: Self-pay | Admitting: Physician Assistant

## 2018-10-15 ENCOUNTER — Encounter: Payer: Self-pay | Admitting: Physician Assistant

## 2018-10-15 ENCOUNTER — Other Ambulatory Visit: Payer: Self-pay

## 2018-10-15 VITALS — BP 118/62 | HR 63 | Temp 97.3°F | Wt 118.0 lb

## 2018-10-15 DIAGNOSIS — M545 Low back pain, unspecified: Secondary | ICD-10-CM

## 2018-10-15 MED ORDER — CYCLOBENZAPRINE HCL 10 MG PO TABS: 10.0000 mg | ORAL_TABLET | Freq: Three times a day (TID) | ORAL | 1 refills | Status: DC | PRN

## 2018-10-15 MED ORDER — IBUPROFEN 800 MG PO TABS: 800.0000 mg | ORAL_TABLET | Freq: Three times a day (TID) | ORAL | 0 refills | Status: DC | PRN
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
## 2018-10-15 NOTE — Patient Instructions (Signed)
BACK PAIN  Try the exercises and other information in the back care manual.   You can take flexeril if needed at bedtime for muscle spasm. This can be taken up to every 8 hours, but causes sedation, so should not drive or operate heavy machinery while taking this medicine.   Aleve, ibuprofen is an antiinflammatory You can take tylenol (500mg ) or tylenol arthritis (650mg ) with the meloxicam/antiinflammatories. The max you can take of tylenol a day is 3000mg  daily, this is a max of 6 pills a day of the regular tyelnol (500mg ) or a max of 4 a day of the tylenol arthritis (650mg ) as long as no other medications you are taking contain tylenol.   this can cause inflammation in your stomach and can cause ulcers or bleeding, this will look like black tarry stools Make sure you taking it with food Try not to take it daily, take AS needed Can take with pepcid  Go to the ER if you have any new weakness in your legs, have trouble controlling your urine or bowels, or have worsening pain.   If not better we can do an injection or refer to ortho or physical therapy   Back pain Rehab Ask your health care provider which exercises are safe for you. Do exercises exactly as told by your health care provider and adjust them as directed. It is normal to feel mild stretching, pulling, tightness, or discomfort as you do these exercises, but you should stop right away if you feel sudden pain or your pain gets worse. Do not begin these exercises until told by your health care provider. Stretching and range of motion exercises These exercises warm up your muscles and joints and improve the movement and flexibility of your hips and your back. These exercises also help to relieve pain, numbness, and tingling. Exercise A: Sciatic nerve glide 1. Sit in a chair with your head facing down toward your chest. Place your hands behind your back. Let your shoulders slump forward. 2. Slowly straighten one of your knees while you  tilt your head back as if you are looking toward the ceiling. Only straighten your leg as far as you can without making your symptoms worse. 3. Hold for __________ seconds. 4. Slowly return to the starting position. 5. Repeat with your other leg. Repeat __________ times. Complete this exercise __________ times a day. Exercise B: Knee to chest with hip adduction and internal rotation  1. Lie on your back on a firm surface with both legs straight. 2. Bend one of your knees and move it up toward your chest until you feel a gentle stretch in your lower back and buttock. Then, move your knee toward the shoulder that is on the opposite side from your leg. ? Hold your leg in this position by holding onto the front of your knee. 3. Hold for __________ seconds. 4. Slowly return to the starting position. 5. Repeat with your other leg. Repeat __________ times. Complete this exercise __________ times a day. Exercise C: Prone extension on elbows  1. Lie on your abdomen on a firm surface. A bed may be too soft for this exercise. 2. Prop yourself up on your elbows. 3. Use your arms to help lift your chest up until you feel a gentle stretch in your abdomen and your lower back. ? This will place some of your body weight on your elbows. If this is uncomfortable, try stacking pillows under your chest. ? Your hips should stay down, against  the surface that you are lying on. Keep your hip and back muscles relaxed. 4. Hold for __________ seconds. 5. Slowly relax your upper body and return to the starting position. Repeat __________ times. Complete this exercise __________ times a day. Strengthening exercises These exercises build strength and endurance in your back. Endurance is the ability to use your muscles for a long time, even after they get tired. Exercise D: Pelvic tilt 1. Lie on your back on a firm surface. Bend your knees and keep your feet flat. 2. Tense your abdominal muscles. Tip your pelvis up  toward the ceiling and flatten your lower back into the floor. ? To help with this exercise, you may place a small towel under your lower back and try to push your back into the towel. 3. Hold for __________ seconds. 4. Let your muscles relax completely before you repeat this exercise. Repeat __________ times. Complete this exercise __________ times a day. Exercise E: Alternating arm and leg raises  1. Get on your hands and knees on a firm surface. If you are on a hard floor, you may want to use padding to cushion your knees, such as an exercise mat. 2. Line up your arms and legs. Your hands should be below your shoulders, and your knees should be below your hips. 3. Lift your left leg behind you. At the same time, raise your right arm and straighten it in front of you. ? Do not lift your leg higher than your hip. ? Do not lift your arm higher than your shoulder. ? Keep your abdominal and back muscles tight. ? Keep your hips facing the ground. ? Do not arch your back. ? Keep your balance carefully, and do not hold your breath. 4. Hold for __________ seconds. 5. Slowly return to the starting position and repeat with your right leg and your left arm. Repeat __________ times. Complete this exercise __________ times a day. Posture and body mechanics  Body mechanics refers to the movements and positions of your body while you do your daily activities. Posture is part of body mechanics. Good posture and healthy body mechanics can help to relieve stress in your body's tissues and joints. Good posture means that your spine is in its natural S-curve position (your spine is neutral), your shoulders are pulled back slightly, and your head is not tipped forward. The following are general guidelines for applying improved posture and body mechanics to your everyday activities. Standing   When standing, keep your spine neutral and your feet about hip-width apart. Keep a slight bend in your knees. Your  ears, shoulders, and hips should line up.  When you do a task in which you stand in one place for a long time, place one foot up on a stable object that is 2-4 inches (5-10 cm) high, such as a footstool. This helps keep your spine neutral. Sitting   When sitting, keep your spine neutral and keep your feet flat on the floor. Use a footrest, if necessary, and keep your thighs parallel to the floor. Avoid rounding your shoulders, and avoid tilting your head forward.  When working at a desk or a computer, keep your desk at a height where your hands are slightly lower than your elbows. Slide your chair under your desk so you are close enough to maintain good posture.  When working at a computer, place your monitor at a height where you are looking straight ahead and you do not have to tilt your head  forward or downward to look at the screen. Resting   When lying down and resting, avoid positions that are most painful for you.  If you have pain with activities such as sitting, bending, stooping, or squatting (flexion-based activities), lie in a position in which your body does not bend very much. For example, avoid curling up on your side with your arms and knees near your chest (fetal position).  If you have pain with activities such as standing for a long time or reaching with your arms (extension-based activities), lie with your spine in a neutral position and bend your knees slightly. Try the following positions: ? Lying on your side with a pillow between your knees. ? Lying on your back with a pillow under your knees. Lifting   When lifting objects, keep your feet at least shoulder-width apart and tighten your abdominal muscles.  Bend your knees and hips and keep your spine neutral. It is important to lift using the strength of your legs, not your back. Do not lock your knees straight out.  Always ask for help to lift heavy or awkward objects. This information is not intended to replace  advice given to you by your health care provider. Make sure you discuss any questions you have with your health care provider. Document Released: 01/21/2005 Document Revised: 09/28/2015 Document Reviewed: 10/07/2014 Elsevier Interactive Patient Education  [employer]2018 [employer]

## 2018-10-22 ENCOUNTER — Other Ambulatory Visit: Payer: Self-pay | Admitting: Physician Assistant

## 2018-10-22 DIAGNOSIS — M545 Low back pain, unspecified: Secondary | ICD-10-CM

## 2018-10-27 DIAGNOSIS — M545 Low back pain: Secondary | ICD-10-CM | POA: Diagnosis not present

## 2018-10-30 DIAGNOSIS — M545 Low back pain: Secondary | ICD-10-CM | POA: Diagnosis not present

## 2018-11-02 ENCOUNTER — Other Ambulatory Visit: Payer: Self-pay | Admitting: Physician Assistant

## 2018-11-04 DIAGNOSIS — M545 Low back pain: Secondary | ICD-10-CM | POA: Diagnosis not present
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
## 2018-11-05 ENCOUNTER — Other Ambulatory Visit: Payer: Self-pay | Admitting: Physician Assistant

## 2018-11-05 DIAGNOSIS — R232 Flushing: Secondary | ICD-10-CM

## 2018-11-06 DIAGNOSIS — M545 Low back pain: Secondary | ICD-10-CM | POA: Diagnosis not present

## 2018-11-09 ENCOUNTER — Ambulatory Visit: Payer: BC Managed Care – PPO | Admitting: Physician Assistant

## 2018-11-09 ENCOUNTER — Other Ambulatory Visit: Payer: Self-pay

## 2018-11-09 ENCOUNTER — Other Ambulatory Visit: Payer: Self-pay | Admitting: Physician Assistant

## 2018-11-09 ENCOUNTER — Encounter: Payer: Self-pay | Admitting: Physician Assistant

## 2018-11-09 VITALS — BP 118/70 | HR 82 | Temp 97.6°F | Ht 65.0 in | Wt 121.2 lb

## 2018-11-09 DIAGNOSIS — E559 Vitamin D deficiency, unspecified: Secondary | ICD-10-CM | POA: Diagnosis not present

## 2018-11-09 DIAGNOSIS — Z23 Encounter for immunization: Secondary | ICD-10-CM | POA: Diagnosis not present

## 2018-11-09 DIAGNOSIS — Z780 Asymptomatic menopausal state: Secondary | ICD-10-CM

## 2018-11-09 DIAGNOSIS — G2581 Restless legs syndrome: Secondary | ICD-10-CM

## 2018-11-09 DIAGNOSIS — M5442 Lumbago with sciatica, left side: Secondary | ICD-10-CM

## 2018-11-09 DIAGNOSIS — G43909 Migraine, unspecified, not intractable, without status migrainosus: Secondary | ICD-10-CM

## 2018-11-09 DIAGNOSIS — G4700 Insomnia, unspecified: Secondary | ICD-10-CM

## 2018-11-09 DIAGNOSIS — Z13220 Encounter for screening for lipoid disorders: Secondary | ICD-10-CM | POA: Diagnosis not present

## 2018-11-09 DIAGNOSIS — Z1389 Encounter for screening for other disorder: Secondary | ICD-10-CM

## 2018-11-09 DIAGNOSIS — M8580 Other specified disorders of bone density and structure, unspecified site: Secondary | ICD-10-CM

## 2018-11-09 DIAGNOSIS — Z79899 Other long term (current) drug therapy: Secondary | ICD-10-CM

## 2018-11-09 DIAGNOSIS — Z130 Encounter for screening for diseases of the blood and blood-forming organs and certain disorders involving the immune mechanism: Secondary | ICD-10-CM

## 2018-11-09 DIAGNOSIS — J449 Chronic obstructive pulmonary disease, unspecified: Secondary | ICD-10-CM

## 2018-11-09 DIAGNOSIS — Z1329 Encounter for screening for other suspected endocrine disorder: Secondary | ICD-10-CM | POA: Diagnosis not present

## 2018-11-09 DIAGNOSIS — R232 Flushing: Secondary | ICD-10-CM

## 2018-11-09 DIAGNOSIS — F3341 Major depressive disorder, recurrent, in partial remission: Secondary | ICD-10-CM

## 2018-11-09 DIAGNOSIS — Z0000 Encounter for general adult medical examination without abnormal findings: Secondary | ICD-10-CM

## 2018-11-09 DIAGNOSIS — Z0001 Encounter for general adult medical examination with abnormal findings: Secondary | ICD-10-CM

## 2018-11-09 MED ORDER — GABAPENTIN 300 MG PO CAPS: 600.0000 mg | ORAL_CAPSULE | Freq: Every day | ORAL | 0 refills | Status: DC

## 2018-11-09 MED ORDER — PREDNISONE 20 MG PO TABS: ORAL_TABLET | ORAL | 0 refills | Status: AC

## 2018-11-09 MED ORDER — VENLAFAXINE HCL ER 75 MG PO CP24: 75.0000 mg | ORAL_CAPSULE | Freq: Every day | ORAL | 1 refills | Status: DC

## 2018-11-09 MED ORDER — TRELEGY ELLIPTA 100-62.5-25 MCG/INH IN AEPB: 100.0000 ug | INHALATION_SPRAY | Freq: Every day | RESPIRATORY_TRACT | 6 refills | Status: DC

## 2018-11-09 NOTE — Patient Instructions (Signed)
Please take the prednisone to help decrease inflammation and therefore decrease symptoms.  Take it it with food to avoid GI upset.  It can cause increased energy but on the other hand it can make it hard to sleep at night, let us know if this happens.   Do not take NSAIDS with the prednisone like ibuprofen, aleve, motrin, etc.  You can take tylenol If it is not better/improving after 5-6 weeks of conservative therapy with prednisone, flexeril, and PT will get an MRI. MAXIMUM AMOUNT OF TYLENOL IN A DAY  You can take tylenol (500mg ) or tylenol arthritis (650mg ) with the meloxicam/antiinflammatories. The max you can take of tylenol a day is 3000mg  daily, this is a max of 6 pills a day of the regular tyelnol (500mg ) or a max of 4 a day of the tylenol arthritis (650mg ) as long as no other medications you are taking contain tylenol.   SMOKING CESSATION WITH CHANTIX  [employer]  907-463-6613 for more information or for a free program for smoking cessation help.   You can call QUIT SMART 1-800-QUIT-NOW for free nicotine patches or replacement therapy- if they are out- keep calling  [facility] Can call for smoking cessation classes, [phone]  If you have a smart phone, please look up Smoke Free app, this will help you stay on track and give you information about money you have saved, life that you have gained back and a ton of more information.   We are giving you chantix for smoking cessation. You can do it! And we are here to help! You may have heard some scary side effects about chantix, the three most common I hear about are nausea, crazy dreams and depression.  However, I like for my patients to try to stay on 1/2 a tablet twice a day rather than one tablet twice a day as normally prescribed. This helps decrease the chances of side effects and helps save money by making a one month prescription last two months  Please start the prescription this way:  Start  1/2 tablet by mouth once daily after food with a full glass of water for 3 days Then do 1/2 tablet by mouth twice daily for 4 days. During this first week you can smoke, but try to stop after this week.  At this point we have several options: 1) continue on 1/2 tablet twice a day- which I encourage you to do. You can stay on this dose the rest of the time on the medication or if you still feel the need to smoke you can do one of the two options below. 2) do one tablet in the morning and 1/2 in the evening which helps decrease dreams. 3) do one tablet twice a day.   What if I miss a dose? If you miss a dose, take it as soon as you can. If it is almost time for your next dose, take only that dose. Do not take double or extra doses.  What should I watch for while using this medicine? Visit your doctor or health care professional for regular check ups. Ask for ongoing advice and encouragement from your doctor or healthcare professional, friends, and family to help you quit. If you smoke while on this medication, quit again  Your mouth may get dry. Chewing sugarless gum or hard candy, and drinking plenty of water may help. Contact your doctor if the problem does not go away or is severe.  You may get  drowsy or dizzy. Do not drive, use machinery, or do anything that needs mental alertness until you know how this medicine affects you. Do not stand or sit up quickly, especially if you are an older patient.   The use of this medicine may increase the chance of suicidal thoughts or actions. Pay special attention to how you are responding while on this medicine. Any worsening of mood, or thoughts of suicide or dying should be reported to your health care professional right away.  ADVANTAGES OF QUITTING SMOKING  Within 20 minutes, blood pressure decreases. Your pulse is at normal level.  After 8 hours, carbon monoxide levels in the blood return to normal. Your oxygen level increases.  After 24 hours,  the chance of having a heart attack starts to decrease. Your breath, hair, and body stop smelling like smoke.  After 48 hours, damaged nerve endings begin to recover. Your sense of taste and smell improve.  After 72 hours, the body is virtually free of nicotine. Your bronchial tubes relax and breathing becomes easier.  After 2 to 12 weeks, lungs can hold more air. Exercise becomes easier and circulation improves.  After 1 year, the risk of coronary heart disease is cut in half.  After 5 years, the risk of stroke falls to the same as a nonsmoker.  After 10 years, the risk of lung cancer is cut in half and the risk of other cancers decreases significantly.  After 15 years, the risk of coronary heart disease drops, usually to the level of a nonsmoker.  You will have extra money to spend on things other than cigarettes.

## 2018-11-10 DIAGNOSIS — M545 Low back pain: Secondary | ICD-10-CM | POA: Diagnosis not present

## 2018-11-10 LAB — CBC WITH DIFFERENTIAL/PLATELET
Absolute Monocytes: 846 cells/uL (ref 200–950)
Basophils Absolute: 82 cells/uL (ref 0–200)
Basophils Relative: 0.9 %
Eosinophils Absolute: 582 cells/uL — ABNORMAL HIGH (ref 15–500)
Eosinophils Relative: 6.4 %
HCT: 41.6 % (ref 35.0–45.0)
Hemoglobin: 13.8 g/dL (ref 11.7–15.5)
Lymphs Abs: 2512 cells/uL (ref 850–3900)
MCH: 29.2 pg (ref 27.0–33.0)
MCHC: 33.2 g/dL (ref 32.0–36.0)
MCV: 87.9 fL (ref 80.0–100.0)
MPV: 10.5 fL (ref 7.5–12.5)
Monocytes Relative: 9.3 %
Neutro Abs: 5078 cells/uL (ref 1500–7800)
Neutrophils Relative %: 55.8 %
Platelets: 206 10*3/uL (ref 140–400)
RBC: 4.73 10*6/uL (ref 3.80–5.10)
RDW: 12.6 % (ref 11.0–15.0)
Total Lymphocyte: 27.6 %
WBC: 9.1 10*3/uL (ref 3.8–10.8)

## 2018-11-10 LAB — IRON, TOTAL/TOTAL IRON BINDING CAP
%SAT: 40 % (calc) (ref 16–45)
Iron: 136 ug/dL (ref 40–190)
TIBC: 342 mcg/dL (calc) (ref 250–450)

## 2018-11-10 LAB — COMPLETE METABOLIC PANEL WITH GFR
AG Ratio: 1.9 (calc) (ref 1.0–2.5)
ALT: 23 U/L (ref 6–29)
AST: 20 U/L (ref 10–30)
Albumin: 4.5 g/dL (ref 3.6–5.1)
Alkaline phosphatase (APISO): 96 U/L (ref 31–125)
BUN: 11 mg/dL (ref 7–25)
CO2: 31 mmol/L (ref 20–32)
Calcium: 9.6 mg/dL (ref 8.6–10.2)
Chloride: 102 mmol/L (ref 98–110)
Creat: 0.76 mg/dL (ref 0.50–1.10)
GFR, Est African American: 112 mL/min/{1.73_m2} (ref >=60)
GFR, Est Non African American: 97 mL/min/{1.73_m2} (ref >=60)
Globulin: 2.4 g/dL (calc) (ref 1.9–3.7)
Glucose, Bld: 80 mg/dL (ref 65–99)
Potassium: 4.6 mmol/L (ref 3.5–5.3)
Sodium: 138 mmol/L (ref 135–146)
Total Bilirubin: 0.3 mg/dL (ref 0.2–1.2)
Total Protein: 6.9 g/dL (ref 6.1–8.1)

## 2018-11-10 LAB — URINALYSIS, ROUTINE W REFLEX MICROSCOPIC
Bilirubin Urine: NEGATIVE
Glucose, UA: NEGATIVE
Hgb urine dipstick: NEGATIVE
Ketones, ur: NEGATIVE
Leukocytes,Ua: NEGATIVE
Nitrite: NEGATIVE
Protein, ur: NEGATIVE
Specific Gravity, Urine: 1.018 (ref 1.001–1.03)
pH: >=8.5 — AB (ref 5.0–8.0)

## 2018-11-10 LAB — LIPID PANEL
Cholesterol: 214 mg/dL — ABNORMAL HIGH (ref <200)
HDL: 82 mg/dL (ref >=50)
LDL Cholesterol (Calc): 116 mg/dL (calc) — ABNORMAL HIGH
Non-HDL Cholesterol (Calc): 132 mg/dL (calc) — ABNORMAL HIGH (ref <130)
Total CHOL/HDL Ratio: 2.6 (calc) (ref <5.0)
Triglycerides: 70 mg/dL (ref <150)

## 2018-11-10 LAB — MAGNESIUM: Magnesium: 1.9 mg/dL (ref 1.5–2.5)

## 2018-11-10 LAB — VITAMIN D 25 HYDROXY (VIT D DEFICIENCY, FRACTURES): Vit D, 25-Hydroxy: 40 ng/mL (ref 30–100)

## 2018-11-10 LAB — MICROALBUMIN / CREATININE URINE RATIO
Creatinine, Urine: 108 mg/dL (ref 20–275)
Microalb Creat Ratio: 2 mcg/mg creat (ref <30)
Microalb, Ur: 0.2 mg/dL

## 2018-11-10 LAB — TSH: TSH: 1.03 mIU/L

## 2018-11-10 LAB — VITAMIN B12: Vitamin B-12: 996 pg/mL (ref 200–1100)

## 2018-11-11 DIAGNOSIS — Z780 Asymptomatic menopausal state: Secondary | ICD-10-CM | POA: Insufficient documentation

## 2018-11-13 DIAGNOSIS — M545 Low back pain: Secondary | ICD-10-CM | POA: Diagnosis not present

## 2018-11-17 DIAGNOSIS — M545 Low back pain: Secondary | ICD-10-CM | POA: Diagnosis not present

## 2018-11-19 ENCOUNTER — Encounter: Payer: Self-pay | Admitting: Internal Medicine

## 2018-11-24 DIAGNOSIS — M545 Low back pain: Secondary | ICD-10-CM | POA: Diagnosis not present

## 2018-12-01 DIAGNOSIS — M545 Low back pain: Secondary | ICD-10-CM | POA: Diagnosis not present

## 2018-12-09 ENCOUNTER — Inpatient Hospital Stay: Admission: RE | Admit: 2018-12-09 | Payer: BC Managed Care – PPO | Source: Ambulatory Visit

## 2018-12-28 ENCOUNTER — Other Ambulatory Visit: Payer: Self-pay | Admitting: Physician Assistant

## 2018-12-28 DIAGNOSIS — R232 Flushing: Secondary | ICD-10-CM

## 2018-12-28 DIAGNOSIS — F3341 Major depressive disorder, recurrent, in partial remission: Secondary | ICD-10-CM

## 2018-12-28 DIAGNOSIS — G43909 Migraine, unspecified, not intractable, without status migrainosus: Secondary | ICD-10-CM

## 2018-12-28 MED ORDER — VENLAFAXINE HCL ER 150 MG PO CP24: 150.0000 mg | ORAL_CAPSULE | Freq: Every day | ORAL | 1 refills | Status: DC

## 2018-12-29 ENCOUNTER — Other Ambulatory Visit: Payer: Self-pay | Admitting: Physician Assistant

## 2018-12-29 MED ORDER — ALPRAZOLAM 1 MG PO TABS: ORAL_TABLET | ORAL | 0 refills | Status: DC

## 2019-02-03 ENCOUNTER — Other Ambulatory Visit: Payer: Self-pay | Admitting: Physician Assistant

## 2019-02-03 DIAGNOSIS — G4700 Insomnia, unspecified: Secondary | ICD-10-CM

## 2019-02-04 ENCOUNTER — Ambulatory Visit: Payer: BC Managed Care – PPO

## 2019-02-04 ENCOUNTER — Ambulatory Visit
Admission: RE | Admit: 2019-02-04 | Discharge: 2019-02-04 | Disposition: A | Discharge status: Home / Self Care | Payer: BC Managed Care – PPO | Source: Ambulatory Visit | Attending: Physician Assistant | Admitting: Physician Assistant

## 2019-02-04 ENCOUNTER — Other Ambulatory Visit: Payer: Self-pay

## 2019-02-04 DIAGNOSIS — Z1231 Encounter for screening mammogram for malignant neoplasm of breast: Secondary | ICD-10-CM

## 2019-02-17 ENCOUNTER — Other Ambulatory Visit: Payer: Self-pay | Admitting: Physician Assistant

## 2019-02-17 MED ORDER — BUSPIRONE HCL 5 MG PO TABS: 5.0000 mg | ORAL_TABLET | Freq: Two times a day (BID) | ORAL | 0 refills | Status: DC

## 2019-02-17 MED ORDER — CITALOPRAM HYDROBROMIDE 40 MG PO TABS: 40.0000 mg | ORAL_TABLET | Freq: Every day | ORAL | 0 refills | Status: DC

## 2019-02-17 NOTE — Progress Notes (Signed)
44 year old female with family history of breast cancer and ovarian cancer with personal history of early menopause with hot flashes, anxiety, mood swings and trouble sleeping. She has tried progesterone, lexapro, prozac, effexor, gabapentin, klonopin, trintellix. Her mom is on celexa, we will try this medication with buspar for anxiety. She does have xanax as needed.  If this does not help may get genetic testing for metabolites, try paxil, oxybutynin, remeron, imipramine.

## 2019-03-08 ENCOUNTER — Other Ambulatory Visit: Payer: Self-pay | Admitting: Physician Assistant

## 2019-03-08 MED ORDER — ALPRAZOLAM 1 MG PO TABS: ORAL_TABLET | ORAL | 0 refills | Status: DC

## 2019-03-31 ENCOUNTER — Other Ambulatory Visit: Payer: Self-pay | Admitting: Physician Assistant

## 2019-03-31 DIAGNOSIS — G4700 Insomnia, unspecified: Secondary | ICD-10-CM

## 2019-03-31 DIAGNOSIS — G2581 Restless legs syndrome: Secondary | ICD-10-CM

## 2019-03-31 MED ORDER — GABAPENTIN 300 MG PO CAPS: ORAL_CAPSULE | ORAL | 3 refills | Status: DC

## 2019-04-27 ENCOUNTER — Other Ambulatory Visit: Payer: Self-pay | Admitting: Physician Assistant

## 2019-05-11 ENCOUNTER — Other Ambulatory Visit: Payer: Self-pay | Admitting: Physician Assistant

## 2019-06-08 ENCOUNTER — Telehealth: Payer: Self-pay | Admitting: Physician Assistant

## 2019-06-08 DIAGNOSIS — M5442 Lumbago with sciatica, left side: Secondary | ICD-10-CM

## 2019-06-08 DIAGNOSIS — G8929 Other chronic pain: Secondary | ICD-10-CM

## 2019-06-08 MED ORDER — DEXAMETHASONE 0.5 MG PO TABS: ORAL_TABLET | ORAL | 0 refills | Status: DC

## 2019-06-08 MED ORDER — HYDROCODONE-ACETAMINOPHEN 5-325 MG PO TABS: 1.0000 | ORAL_TABLET | Freq: Four times a day (QID) | ORAL | 0 refills | Status: DC | PRN
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
## 2019-06-11 ENCOUNTER — Ambulatory Visit (INDEPENDENT_AMBULATORY_CARE_PROVIDER_SITE_OTHER): Payer: BC Managed Care – PPO

## 2019-06-11 ENCOUNTER — Other Ambulatory Visit: Payer: Self-pay

## 2019-06-11 ENCOUNTER — Encounter: Payer: Self-pay | Admitting: Family Medicine

## 2019-06-11 ENCOUNTER — Ambulatory Visit (INDEPENDENT_AMBULATORY_CARE_PROVIDER_SITE_OTHER): Payer: BC Managed Care – PPO | Admitting: Family Medicine

## 2019-06-11 DIAGNOSIS — M5442 Lumbago with sciatica, left side: Secondary | ICD-10-CM

## 2019-06-11 MED ORDER — BACLOFEN 10 MG PO TABS: 5.0000 mg | ORAL_TABLET | Freq: Three times a day (TID) | ORAL | 3 refills | Status: DC | PRN
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
## 2019-07-21 ENCOUNTER — Other Ambulatory Visit: Payer: Self-pay

## 2019-07-21 ENCOUNTER — Ambulatory Visit
Admission: RE | Admit: 2019-07-21 | Discharge: 2019-07-21 | Disposition: A | Discharge status: Home / Self Care | Payer: BC Managed Care – PPO | Source: Ambulatory Visit | Attending: Family Medicine | Admitting: Family Medicine

## 2019-07-21 DIAGNOSIS — M5442 Lumbago with sciatica, left side: Secondary | ICD-10-CM

## 2019-07-21 DIAGNOSIS — M48061 Spinal stenosis, lumbar region without neurogenic claudication: Secondary | ICD-10-CM | POA: Diagnosis not present

## 2019-07-22 ENCOUNTER — Telehealth: Payer: Self-pay | Admitting: Family Medicine

## 2019-07-22 NOTE — Telephone Encounter (Signed)
Lumbar MRI scan shows a disc protrusion at L2-3 which is near the left L2 nerve root.  It could be irritated depending on positioning.

## 2019-07-27 ENCOUNTER — Telehealth: Payer: Self-pay

## 2019-07-27 ENCOUNTER — Other Ambulatory Visit: Payer: Self-pay | Admitting: Family Medicine

## 2019-07-27 DIAGNOSIS — M5442 Lumbago with sciatica, left side: Secondary | ICD-10-CM

## 2019-07-27 NOTE — Telephone Encounter (Signed)
Patient called in to get her epid inject scheduled with hilts.

## 2019-07-27 NOTE — Telephone Encounter (Signed)
Ordered

## 2019-07-27 NOTE — Telephone Encounter (Signed)
Patient would like to proceed with an ESI.

## 2019-08-04 ENCOUNTER — Other Ambulatory Visit: Payer: Self-pay | Admitting: Internal Medicine

## 2019-08-06 ENCOUNTER — Other Ambulatory Visit: Payer: Self-pay

## 2019-08-06 ENCOUNTER — Ambulatory Visit
Admission: RE | Admit: 2019-08-06 | Discharge: 2019-08-06 | Disposition: A | Discharge status: Home / Self Care | Payer: BC Managed Care – PPO | Source: Ambulatory Visit | Attending: Family Medicine | Admitting: Family Medicine

## 2019-08-06 DIAGNOSIS — M5442 Lumbago with sciatica, left side: Secondary | ICD-10-CM

## 2019-08-06 DIAGNOSIS — M5126 Other intervertebral disc displacement, lumbar region: Secondary | ICD-10-CM | POA: Diagnosis not present

## 2019-08-06 MED ORDER — METHYLPREDNISOLONE ACETATE 40 MG/ML INJ SUSP (RADIOLOG
120.0000 mg | Freq: Once | INTRAMUSCULAR | Status: AC
  Administered 2019-08-06: 120 mg via EPIDURAL

## 2019-08-06 MED ORDER — IOPAMIDOL (ISOVUE-M 200) INJECTION 41%
1.0000 mL | Freq: Once | INTRAMUSCULAR | Status: AC
  Administered 2019-08-06: 1 mL via EPIDURAL

## 2019-08-06 NOTE — Discharge Instructions (Signed)

## 2019-08-09 ENCOUNTER — Other Ambulatory Visit: Payer: Self-pay | Admitting: Physician Assistant

## 2019-11-01 ENCOUNTER — Other Ambulatory Visit: Payer: Self-pay | Admitting: Adult Health

## 2019-11-08 DIAGNOSIS — G8929 Other chronic pain: Secondary | ICD-10-CM | POA: Insufficient documentation

## 2019-11-08 NOTE — Progress Notes (Signed)
Complete Physical  Assessment and Plan: Depression, major, recurrent, in partial remission (HCC) Continue celexa 40 mg, getting genetic testing for best medication- will send report when she receives it.     RLS (restless legs syndrome) -     gabapentin (NEURONTIN) 300 MG capsule; Take 1 capsule (300 mg total) by mouth at bedtime. - get back to working out  Insomnia, unspecified type -     gabapentin (NEURONTIN) 300 MG capsule; Take 1 capsule (300 mg total) by mouth at bedtime. Can do XANAX PRN for sleep but doing well on the Neurontin.   Migraine without status migrainosus, not intractable, unspecified migraine type - continue gabapentin  Vaginal atrophy -    Has tried OTC and vaginal premarin without help Will refer to GYN for possible MONALISA treatment  Osteopenia, unspecified location -     Osteopenia- get dexa 2022 WITH HER MAMMOGRAM, continue Vit D and Ca, weight bearing exercises  Screening cholesterol level -     Lipid panel  Vitamin D deficiency -     VITAMIN D 25 Hydroxy (Vit-D Deficiency, Fractures)  Medication management -     CBC with Differential/Platelet -     BASIC METABOLIC PANEL WITH GFR -     Hepatic function panel -     Magnesium  Screening for hematuria or proteinuria -     Urinalysis, Routine w reflex microscopic -     Microalbumin / creatinine urine ratio  Encounter for general adult medical examination with abnormal findings Get MGM, ? Would benefit from breast MRI- will follow up with GYN  Chronic obstructive pulmonary disease, unspecified COPD type (HCC) Advised to stop smoking - risks discussed. Declines medications.  Could not tolerate chantix CXR annually Currently stable without respiratory medications   Discussed med's effects and SE's. Screening labs and tests as requested with regular follow-up as recommended. Over 40 minutes of exam, counseling, chart review, and complex, high level critical decision making was performed this  visit.   HPI  44 y.o. female  presents for a complete physical and follow up for has Depression, major, recurrent, in partial remission (HCC); RLS (restless legs syndrome); Insomnia; Osteopenia; COPD (chronic obstructive pulmonary disease) (HCC); Vitamin D deficiency; Migraine without status migrainosus, not intractable; Post-menopausal; History of colitis; GAD (generalized anxiety disorder); Cigarette nicotine dependence without complication; Genetic testing; and Chronic left-sided low back pain with left-sided sciatica on their problem list..  Her blood pressure has been controlled at home, today their BP is BP: 114/76 She does not workout, due to back pain right now. She denies chest pain, shortness of breath, dizziness.   She has a history of depression, anxiety, she has tried and failed lexapro, effexor, wellbutrin, paxil, prozac. She is currently on celexa and still feels that it is not helping with mood. She has a short fuse, will cry, very frustrated. She did get a DNA test to help determine the best medication for her. She has not tried some of the newer medications.   She is still smoking, she would like to quit but is very stressed. She tried very low dose chantix and could not tolerate it.   She does have vaginal dryness, has tried OTC and premarin that did not help. She has empty egg syndrome and early menopause, strong family history of breast cancer and grandmother with ovarian cancer.  MGM 01/2019- ? Breast MRI with history She had genetic counseling with [facility] that was negative, 01/2016.   She is on vitamin D and  B12 supplement.  Lab Results  Component Value Date   VITAMINB12 996 11/09/2018   Lab Results  Component Value Date   VD25OH 40 11/09/2018   BMI is Body mass index is 18.76 kg/m., she is not working out so she has had weight loss. Wt Readings from Last 3 Encounters:  11/09/19 111 lb (50.3 kg)  11/09/18 121 lb 3.2 oz (55 kg)  10/15/18 118 lb (53.5 kg)      Current Medications:      Current Outpatient Medications (Analgesics):  .  ibuprofen (ADVIL) 800 MG tablet, Take 1 tablet (800 mg total) by mouth every 8 (eight) hours as needed for cramping.   Current Outpatient Medications (Other):  Marland Kitchen  ALPRAZolam (XANAX) 1 MG tablet, TAKE 1/2 TABLET BY MOUTH TWICE A DAY AS NEEDED FOR ANXIETY .  baclofen (LIORESAL) 10 MG tablet, Take 0.5-1 tablets (5-10 mg total) by mouth 3 (three) times daily as needed for muscle spasms. .  citalopram (CELEXA) 40 MG tablet, TAKE 1 TABLET  DAILY FOR MOOD .  conjugated estrogens (PREMARIN) vaginal cream, Place 1 Applicatorful vaginally daily. Marland Kitchen  escitalopram (LEXAPRO) 20 MG tablet, Take 20 mg by mouth daily. Marland Kitchen  gabapentin (NEURONTIN) 300 MG capsule, Take 1 to 2 capsules at Bedtime for Restless Legs & Sleep .  Vitamins/Minerals TABS, Take by mouth.  Allergies:  No Known Allergies   Medical History:  She has Depression, major, recurrent, in partial remission (HCC); RLS (restless legs syndrome); Insomnia; Osteopenia; COPD (chronic obstructive pulmonary disease) (HCC); Vitamin D deficiency; Migraine without status migrainosus, not intractable; Post-menopausal; History of colitis; GAD (generalized anxiety disorder); Cigarette nicotine dependence without complication; Genetic testing; and Chronic left-sided low back pain with left-sided sciatica on their problem list.   Health Maintenance:   Health Maintenance  Topic Date Due  . Hepatitis C Screening  Never done  . COVID-19 Vaccine (1) Never done  . TETANUS/TDAP  Never done  . PAP SMEAR-Modifier  09/04/2017  . INFLUENZA VACCINE  09/05/2019  . HIV Screening  Completed    Tetanus: will get soon   Pneumovax: declines Prevnar 13: declines Flu vaccine: declines Zostavax: declines  No LMP recorded. Patient is premenopausal. Pap: 2018- Dr. Tenny Craw- neg HPV due 2023 MGM: 01/2019 DEXA: 2019, -1.9 right femur DUE with MGM Colonoscopy: 2017 EGD: N/A : Patient Care  Team: Lucky Cowboy, MD as PCP - General (Internal Medicine)  Surgical History:  She has no past surgical history on file.   Family History:  Herfamily history includes Alcohol abuse in her father; Breast cancer in her mother; Cancer (age of onset: 87) in her mother; Cancer (age of onset: 92) in her maternal grandmother; Diabetes (age of onset: 5) in her father; Heart disease in her maternal grandfather.   Social History:  She reports that she has been smoking cigarettes. She has a 5.00 pack-year smoking history. She has never used smokeless tobacco. She reports that she does not drink alcohol and does not use drugs.  Review of Systems: Review of Systems  Constitutional: Negative.   HENT: Negative.   Eyes: Negative.   Respiratory: Negative.   Cardiovascular: Negative.   Gastrointestinal: Negative.   Genitourinary: Negative.   Musculoskeletal: Negative.   Skin: Negative.     Physical Exam: Estimated body mass index is 18.76 kg/m as calculated from the following:   Height as of this encounter: 5' 4.5" (1.638 m).   Weight as of this encounter: 111 lb (50.3 kg). BP 114/76   Pulse 61  Temp (!) 97.3 F (36.3 C)   Resp 12   Ht 5' 4.5" (1.638 m)   Wt 111 lb (50.3 kg)   SpO2 96%   BMI 18.76 kg/m  General Appearance: Well nourished, in no apparent distress.  Eyes: PERRLA, EOMs, conjunctiva no swelling or erythema, normal fundi and vessels.  Sinuses: No Frontal/maxillary tenderness  ENT/Mouth: Ext aud canals clear, normal light reflex with TMs without erythema, bulging. Good dentition. No erythema, swelling, or exudate on post pharynx. Tonsils not swollen or erythematous. Hearing normal.  Neck: Supple, thyroid normal. No bruits  Respiratory: Respiratory effort normal, BS equal bilaterally without rales, rhonchi, wheezing or stridor.  Cardio: RRR without murmurs, rubs or gallops. Brisk peripheral pulses without edema.  Chest: symmetric, with normal excursions and percussion.   Breasts: Symmetric, without lumps, nipple discharge, retractions.  Abdomen: Soft, nontender, no guarding, rebound, hernias, masses, or organomegaly.  Lymphatics: Non tender without lymphadenopathy.  Genitourinary: defer Musculoskeletal: Full ROM all peripheral extremities. Skin: 2 mm dark nevus, good borders, unchanged on right flank, 1.5 mm new center of her back. Warm, dry without rashes, lesions, ecchymosis. Neuro: Cranial nerves intact, reflexes equal bilaterally. Normal muscle tone, no cerebellar symptoms. Sensation intact.  Psych: Awake and oriented X 3, normal affect, Insight and Judgment appropriate.    EKG: defer  Quentin Mulling 9:12 AM [facility]

## 2019-11-09 ENCOUNTER — Other Ambulatory Visit: Payer: Self-pay

## 2019-11-09 ENCOUNTER — Encounter: Payer: Self-pay | Admitting: Physician Assistant

## 2019-11-09 ENCOUNTER — Ambulatory Visit (INDEPENDENT_AMBULATORY_CARE_PROVIDER_SITE_OTHER): Payer: BC Managed Care – PPO | Admitting: Physician Assistant

## 2019-11-09 VITALS — BP 114/76 | HR 61 | Temp 97.3°F | Resp 12 | Ht 64.5 in | Wt 111.0 lb

## 2019-11-09 DIAGNOSIS — D649 Anemia, unspecified: Secondary | ICD-10-CM

## 2019-11-09 DIAGNOSIS — Z1329 Encounter for screening for other suspected endocrine disorder: Secondary | ICD-10-CM | POA: Diagnosis not present

## 2019-11-09 DIAGNOSIS — Z803 Family history of malignant neoplasm of breast: Secondary | ICD-10-CM

## 2019-11-09 DIAGNOSIS — Z79899 Other long term (current) drug therapy: Secondary | ICD-10-CM

## 2019-11-09 DIAGNOSIS — F17210 Nicotine dependence, cigarettes, uncomplicated: Secondary | ICD-10-CM

## 2019-11-09 DIAGNOSIS — G43909 Migraine, unspecified, not intractable, without status migrainosus: Secondary | ICD-10-CM

## 2019-11-09 DIAGNOSIS — G2581 Restless legs syndrome: Secondary | ICD-10-CM

## 2019-11-09 DIAGNOSIS — Z0000 Encounter for general adult medical examination without abnormal findings: Secondary | ICD-10-CM | POA: Diagnosis not present

## 2019-11-09 DIAGNOSIS — Z8719 Personal history of other diseases of the digestive system: Secondary | ICD-10-CM

## 2019-11-09 DIAGNOSIS — F411 Generalized anxiety disorder: Secondary | ICD-10-CM

## 2019-11-09 DIAGNOSIS — Z13220 Encounter for screening for lipoid disorders: Secondary | ICD-10-CM | POA: Diagnosis not present

## 2019-11-09 DIAGNOSIS — Z780 Asymptomatic menopausal state: Secondary | ICD-10-CM

## 2019-11-09 DIAGNOSIS — E559 Vitamin D deficiency, unspecified: Secondary | ICD-10-CM

## 2019-11-09 DIAGNOSIS — G4700 Insomnia, unspecified: Secondary | ICD-10-CM

## 2019-11-09 DIAGNOSIS — Z1389 Encounter for screening for other disorder: Secondary | ICD-10-CM

## 2019-11-09 DIAGNOSIS — Z0001 Encounter for general adult medical examination with abnormal findings: Secondary | ICD-10-CM

## 2019-11-09 DIAGNOSIS — Z130 Encounter for screening for diseases of the blood and blood-forming organs and certain disorders involving the immune mechanism: Secondary | ICD-10-CM | POA: Diagnosis not present

## 2019-11-09 DIAGNOSIS — N952 Postmenopausal atrophic vaginitis: Secondary | ICD-10-CM

## 2019-11-09 DIAGNOSIS — F3341 Major depressive disorder, recurrent, in partial remission: Secondary | ICD-10-CM

## 2019-11-09 DIAGNOSIS — J449 Chronic obstructive pulmonary disease, unspecified: Secondary | ICD-10-CM

## 2019-11-09 DIAGNOSIS — G8929 Other chronic pain: Secondary | ICD-10-CM

## 2019-11-09 DIAGNOSIS — M8580 Other specified disorders of bone density and structure, unspecified site: Secondary | ICD-10-CM

## 2019-11-09 MED ORDER — ALPRAZOLAM 1 MG PO TABS: ORAL_TABLET | ORAL | 1 refills | Status: DC

## 2019-11-09 NOTE — Patient Instructions (Addendum)
DUE FOR TDAP- GET SOON Get influenza.   Will send for possible MONALISA with GYN

## 2019-11-10 LAB — MICROALBUMIN / CREATININE URINE RATIO
Creatinine, Urine: 189 mg/dL (ref 20–275)
Microalb Creat Ratio: 4 mcg/mg creat (ref <30)
Microalb, Ur: 0.7 mg/dL

## 2019-11-10 LAB — LIPID PANEL
Cholesterol: 202 mg/dL — ABNORMAL HIGH (ref <200)
HDL: 78 mg/dL (ref >=50)
LDL Cholesterol (Calc): 108 mg/dL (calc) — ABNORMAL HIGH
Non-HDL Cholesterol (Calc): 124 mg/dL (calc) (ref <130)
Total CHOL/HDL Ratio: 2.6 (calc) (ref <5.0)
Triglycerides: 69 mg/dL (ref <150)

## 2019-11-10 LAB — COMPLETE METABOLIC PANEL WITH GFR
AG Ratio: 2.2 (calc) (ref 1.0–2.5)
ALT: 12 U/L (ref 6–29)
AST: 16 U/L (ref 10–30)
Albumin: 4.6 g/dL (ref 3.6–5.1)
Alkaline phosphatase (APISO): 89 U/L (ref 31–125)
BUN: 13 mg/dL (ref 7–25)
CO2: 28 mmol/L (ref 20–32)
Calcium: 9.7 mg/dL (ref 8.6–10.2)
Chloride: 103 mmol/L (ref 98–110)
Creat: 0.78 mg/dL (ref 0.50–1.10)
GFR, Est African American: 108 mL/min/{1.73_m2} (ref >=60)
GFR, Est Non African American: 93 mL/min/{1.73_m2} (ref >=60)
Globulin: 2.1 g/dL (calc) (ref 1.9–3.7)
Glucose, Bld: 68 mg/dL (ref 65–99)
Potassium: 4.2 mmol/L (ref 3.5–5.3)
Sodium: 140 mmol/L (ref 135–146)
Total Bilirubin: 0.4 mg/dL (ref 0.2–1.2)
Total Protein: 6.7 g/dL (ref 6.1–8.1)

## 2019-11-10 LAB — URINALYSIS, ROUTINE W REFLEX MICROSCOPIC
Bacteria, UA: NONE SEEN /HPF
Bilirubin Urine: NEGATIVE
Glucose, UA: NEGATIVE
Hgb urine dipstick: NEGATIVE
Hyaline Cast: NONE SEEN /LPF
Ketones, ur: NEGATIVE
Nitrite: NEGATIVE
RBC / HPF: NONE SEEN /HPF (ref 0–2)
Specific Gravity, Urine: 1.021 (ref 1.001–1.03)
pH: 8 (ref 5.0–8.0)

## 2019-11-10 LAB — IRON, TOTAL/TOTAL IRON BINDING CAP
%SAT: 30 % (calc) (ref 16–45)
Iron: 113 ug/dL (ref 40–190)
TIBC: 377 mcg/dL (calc) (ref 250–450)

## 2019-11-10 LAB — TSH: TSH: 1.04 mIU/L

## 2019-11-10 LAB — FERRITIN: Ferritin: 45 ng/mL (ref 16–232)

## 2019-11-10 LAB — MAGNESIUM: Magnesium: 1.9 mg/dL (ref 1.5–2.5)

## 2019-11-10 LAB — VITAMIN B12: Vitamin B-12: 415 pg/mL (ref 200–1100)

## 2019-11-10 LAB — VITAMIN D 25 HYDROXY (VIT D DEFICIENCY, FRACTURES): Vit D, 25-Hydroxy: 34 ng/mL (ref 30–100)

## 2019-12-24 DIAGNOSIS — K0822 Moderate atrophy of the mandible: Secondary | ICD-10-CM | POA: Diagnosis not present

## 2020-01-18 DIAGNOSIS — M6281 Muscle weakness (generalized): Secondary | ICD-10-CM | POA: Diagnosis not present

## 2020-01-18 DIAGNOSIS — M62838 Other muscle spasm: Secondary | ICD-10-CM | POA: Diagnosis not present

## 2020-01-18 DIAGNOSIS — N941 Unspecified dyspareunia: Secondary | ICD-10-CM | POA: Diagnosis not present

## 2020-01-18 DIAGNOSIS — R3911 Hesitancy of micturition: Secondary | ICD-10-CM | POA: Diagnosis not present

## 2020-01-25 DIAGNOSIS — R3911 Hesitancy of micturition: Secondary | ICD-10-CM | POA: Diagnosis not present

## 2020-01-25 DIAGNOSIS — M6281 Muscle weakness (generalized): Secondary | ICD-10-CM | POA: Diagnosis not present

## 2020-01-25 DIAGNOSIS — M62838 Other muscle spasm: Secondary | ICD-10-CM | POA: Diagnosis not present

## 2020-01-25 DIAGNOSIS — N941 Unspecified dyspareunia: Secondary | ICD-10-CM | POA: Diagnosis not present

## 2020-02-05 DIAGNOSIS — S060X9A Concussion with loss of consciousness of unspecified duration, initial encounter: Secondary | ICD-10-CM

## 2020-02-05 DIAGNOSIS — S060XAA Concussion with loss of consciousness status unknown, initial encounter: Secondary | ICD-10-CM

## 2020-02-05 HISTORY — DX: Concussion with loss of consciousness status unknown, initial encounter: S06.0XAA

## 2020-02-05 HISTORY — DX: Concussion with loss of consciousness of unspecified duration, initial encounter: S06.0X9A

## 2020-02-07 ENCOUNTER — Other Ambulatory Visit: Payer: Self-pay | Admitting: Internal Medicine

## 2020-02-08 DIAGNOSIS — N941 Unspecified dyspareunia: Secondary | ICD-10-CM | POA: Diagnosis not present

## 2020-02-08 DIAGNOSIS — M62838 Other muscle spasm: Secondary | ICD-10-CM | POA: Diagnosis not present

## 2020-02-08 DIAGNOSIS — M6281 Muscle weakness (generalized): Secondary | ICD-10-CM | POA: Diagnosis not present

## 2020-02-29 DIAGNOSIS — Z1231 Encounter for screening mammogram for malignant neoplasm of breast: Secondary | ICD-10-CM | POA: Diagnosis not present

## 2020-02-29 DIAGNOSIS — Z01419 Encounter for gynecological examination (general) (routine) without abnormal findings: Secondary | ICD-10-CM | POA: Diagnosis not present

## 2020-02-29 DIAGNOSIS — Z681 Body mass index (BMI) 19 or less, adult: Secondary | ICD-10-CM | POA: Diagnosis not present

## 2020-02-29 LAB — HM MAMMOGRAPHY

## 2020-03-21 DIAGNOSIS — M6281 Muscle weakness (generalized): Secondary | ICD-10-CM | POA: Diagnosis not present

## 2020-03-21 DIAGNOSIS — R3911 Hesitancy of micturition: Secondary | ICD-10-CM | POA: Diagnosis not present

## 2020-03-21 DIAGNOSIS — N941 Unspecified dyspareunia: Secondary | ICD-10-CM | POA: Diagnosis not present

## 2020-03-21 DIAGNOSIS — M62838 Other muscle spasm: Secondary | ICD-10-CM | POA: Diagnosis not present

## 2020-03-28 DIAGNOSIS — N941 Unspecified dyspareunia: Secondary | ICD-10-CM | POA: Diagnosis not present

## 2020-03-28 DIAGNOSIS — M6281 Muscle weakness (generalized): Secondary | ICD-10-CM | POA: Diagnosis not present

## 2020-03-28 DIAGNOSIS — M62838 Other muscle spasm: Secondary | ICD-10-CM | POA: Diagnosis not present

## 2020-04-07 ENCOUNTER — Other Ambulatory Visit: Payer: BC Managed Care – PPO

## 2020-04-11 DIAGNOSIS — M6281 Muscle weakness (generalized): Secondary | ICD-10-CM | POA: Diagnosis not present

## 2020-04-11 DIAGNOSIS — M62838 Other muscle spasm: Secondary | ICD-10-CM | POA: Diagnosis not present

## 2020-04-11 DIAGNOSIS — N941 Unspecified dyspareunia: Secondary | ICD-10-CM | POA: Diagnosis not present

## 2020-04-13 ENCOUNTER — Other Ambulatory Visit: Payer: Self-pay

## 2020-04-13 ENCOUNTER — Ambulatory Visit
Admission: RE | Admit: 2020-04-13 | Discharge: 2020-04-13 | Disposition: A | Discharge status: Home / Self Care | Payer: BC Managed Care – PPO | Source: Ambulatory Visit | Attending: Physician Assistant | Admitting: Physician Assistant

## 2020-04-13 DIAGNOSIS — Z780 Asymptomatic menopausal state: Secondary | ICD-10-CM

## 2020-04-13 DIAGNOSIS — M8580 Other specified disorders of bone density and structure, unspecified site: Secondary | ICD-10-CM

## 2020-04-14 ENCOUNTER — Telehealth: Payer: Self-pay

## 2020-04-14 DIAGNOSIS — M8589 Other specified disorders of bone density and structure, multiple sites: Secondary | ICD-10-CM | POA: Diagnosis not present

## 2020-04-14 NOTE — Telephone Encounter (Signed)
Patient inquiring about Bone Density results. Please advise.

## 2020-04-14 NOTE — Progress Notes (Signed)
Assessment and Plan:  Victoria Ashley was seen today for fatigue.  Diagnoses and all orders for this visit:  Hair loss Generalized, doesn't appear scarring at this time, normal scalp. 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  Check labs Work to increase oral intake and nutritional status, stress management reviewed Monitor; follow up if not improving in 6-8 weeks consider derm referral  -     CBC with Differential/Platelet -     COMPLETE METABOLIC PANEL WITH GFR -     TSH  Weight loss -     TSH  Fatigue, unspecified type -     CBC with Differential/Platelet -     TSH -     Vitamin B12  B12 deficiency -     Vitamin B12  GAD (generalized anxiety disorder) Trying alternate agent to evaluate response Stop celexa, start zoloft 100 mg daily; has needed high doses in the past; if tolerating well but inadequate response try taper up to 150 mg then 200 mg/day if needed.  -     sertraline (ZOLOFT) 100 MG tablet; Take 1 tablet (100 mg total) by mouth daily.  BMI less than 19,adult She attributes weight loss to stress and consequent poor appetite; reports improving; working to increase intake, protein supplement Goal to get back to baseline 110lb  Further disposition pending results of labs. Discussed med's effects and SE's.   Over 30 minutes of exam, counseling, chart review, and critical decision making was performed.   Future Appointments  Date Time Provider Department Center  11/08/2020  2:00 PM McClanahan, Bella Kennedy, NP [facility] None   ------------------------------------------------------------------------------------------------------------------   HPI 45 y.o.female with GAD, presents for evaluation of hair loss. She is new to me; adoptive mother of 44 month old twins, boy and girl.   She reports in the last 2 months has noted rapid hair loss, generalized, when she would brush excessive amount coming out, also noting drain clogging. She has not noted hair  breaking, scalp irritation/itching. Denies skin changes. She does note fatigue, but no changes in this time period, attributes to parenthood. Denies constipation.   Low BMI -    BMI is Body mass index is 17.24 kg/m., she has been working on diet and exercise. Admits nausea and poor oral intake with stress, has had signicantly this past year with adopted twins. Reports this is actually improving. She reports had gained 4 lb, but recently had vomiting/diarrhea x 24 hours after eating seafood and working to regain. Have added collagen/protein supplement. Baseline weight around 110 lb, if working out will gain up to 113 lb.  Wt Readings from Last 3 Encounters:  04/17/20 102 lb (46.3 kg)  11/09/19 111 lb (50.3 kg)  11/09/18 121 lb 3.2 oz (55 kg)   No hx of thyroid abnormality Lab Results  Component Value Date   TSH 1.04 11/09/2019   Does have hx of B12 def, has been off of supplement for some time Lab Results  Component Value Date   VITAMINB12 415 11/09/2019   Does have long hx of GAD, has been on prozac, lexapro, currently celexa 40 mg daily; has also tried effexor, trazodone in the past; would like to try something else to see if better response.    No past medical history on file.   No Known Allergies  Current Outpatient Medications on File Prior to Visit  Medication Sig  . ALPRAZolam (XANAX) 1 MG tablet TAKE 1/2 TABLET BY MOUTH TWICE A DAY AS NEEDED FOR ANXIETY  .  baclofen (LIORESAL) 10 MG tablet Take 0.5-1 tablets (5-10 mg total) by mouth 3 (three) times daily as needed for muscle spasms.  Marland Kitchen gabapentin (NEURONTIN) 300 MG capsule Take 1 to 2 capsules at Bedtime for Restless Legs & Sleep  . ibuprofen (ADVIL) 800 MG tablet Take 1 tablet (800 mg total) by mouth every 8 (eight) hours as needed for cramping.  Marland Kitchen VITAMIN D, CHOLECALCIFEROL, PO Take 5,000 Units by mouth.  . Vitamins/Minerals TABS Take by mouth.   No current facility-administered medications on file prior to visit.     ROS: all negative except above.   Physical Exam:  BP 110/66   Pulse 73   Temp (!) 96.8 F (36 C)   Wt 102 lb (46.3 kg)   SpO2 98%   BMI 17.24 kg/m   General Appearance: Well dressed, well groomed very thin adult female, in no apparent distress. Eyes: PERRLA, conjunctiva no swelling or erythema ENT/Mouth: No erythema, swelling, or exudate on post pharynx.  Tonsils not swollen or erythematous. Hearing normal.  Neck: Supple, thyroid normal.  Respiratory: Respiratory effort normal, BS equal bilaterally without rales, rhonchi, wheezing or stridor.  Cardio: RRR with no MRGs. Brisk peripheral pulses without edema.  Abdomen: Soft, + BS.  Non tender, no guarding, rebound, hernias, masses. Lymphatics: Non tender without lymphadenopathy.  Musculoskeletal: no obvious deformity, normal gait.  Skin: Warm, dry without rashes, lesions, ecchymosis. Scalp with generalized thinning of hair, generalized, mild. No patching/scarring, no erythema, flaking.  Neuro: Normal muscle tone Psych: Awake and oriented X 3, normal affect, Insight and Judgment appropriate.     Dan Maker, NP 5:20 PM [facility]

## 2020-04-17 ENCOUNTER — Encounter: Payer: Self-pay | Admitting: Adult Health

## 2020-04-17 ENCOUNTER — Ambulatory Visit: Payer: BC Managed Care – PPO | Admitting: Adult Health

## 2020-04-17 ENCOUNTER — Other Ambulatory Visit: Payer: Self-pay

## 2020-04-17 VITALS — BP 110/66 | HR 73 | Temp 96.8°F | Wt 102.0 lb

## 2020-04-17 DIAGNOSIS — R5383 Other fatigue: Secondary | ICD-10-CM | POA: Diagnosis not present

## 2020-04-17 DIAGNOSIS — E538 Deficiency of other specified B group vitamins: Secondary | ICD-10-CM | POA: Diagnosis not present

## 2020-04-17 DIAGNOSIS — L659 Nonscarring hair loss, unspecified: Secondary | ICD-10-CM | POA: Diagnosis not present

## 2020-04-17 DIAGNOSIS — R634 Abnormal weight loss: Secondary | ICD-10-CM

## 2020-04-17 DIAGNOSIS — Z681 Body mass index (BMI) 19 or less, adult: Secondary | ICD-10-CM

## 2020-04-17 DIAGNOSIS — F411 Generalized anxiety disorder: Secondary | ICD-10-CM

## 2020-04-17 HISTORY — DX: Body mass index (BMI) 19.9 or less, adult: Z68.1

## 2020-04-17 MED ORDER — SERTRALINE HCL 100 MG PO TABS: 100.0000 mg | ORAL_TABLET | Freq: Every day | ORAL | 0 refills | Status: DC

## 2020-04-17 NOTE — Patient Instructions (Addendum)
Will check for various causes of hair loss (thyroid, anemia, etc), but a common explanation when losing hair all over is telogen effluvium  Recommend working on good diet   Try to increase plant variety - ideal is 30+/week (includes grains, beans, nuts/seeds, fruits veggies- so mixed nuts might count for 4-5)   Try to chose foods you haven't had in a while  Stop celexa - start zoloft 100 mg - daily  In 4 weeks, let me know how you're doing - we can increase to 150 mg, then 200 mg if needed  Try deep breathing exercises, medication, yoga to help with stress management and to help reduce smoking   Telogen Effluvium  Telogen effluvium is a condition in which the body sheds much hair. People describe that they are losing hair "from the roots" in excessive amounts. The hair loss is usually 3 to 6 months following an event. The inciting event may be childbirth, a surgery, an illness with a fever, a traumatic psychological event, weight loss, or the start of a new medication. Some people have no known inciting event. For most people, no treatment is necessary, and the hair will stop falling out and begin to regrow with time. Some women develop a chronic form of telogen effluvium in which they continue to lose hair at an accelerated rate.      Sertraline Tablets What is this medicine? SERTRALINE (SER tra leen) is used to treat depression. It may also be used to treat obsessive compulsive disorder, panic disorder, post-trauma stress disorder, premenstrual dysphoric disorder (PMDD) or social anxiety. This medicine may be used for other purposes; ask your health care provider or pharmacist if you have questions. COMMON BRAND NAME(S): Zoloft What should I tell my health care provider before I take this medicine? They need to know if you have any of these conditions:  bleeding disorders  bipolar disorder or a family history of bipolar disorder  glaucoma  heart disease  high blood  pressure  history of irregular heartbeat  history of low levels of calcium, magnesium, or potassium in the blood  if you often drink alcohol  liver disease  receiving electroconvulsive therapy  seizures  suicidal thoughts, plans, or attempt; a previous suicide attempt by you or a family member  take medicines that treat or prevent blood clots  thyroid disease  an unusual or allergic reaction to sertraline, other medicines, foods, dyes, or preservatives  pregnant or trying to get pregnant  breast-feeding How should I use this medicine? Take this medicine by mouth with a glass of water. Follow the directions on the prescription label. You can take it with or without food. Take your medicine at regular intervals. Do not take your medicine more often than directed. Do not stop taking this medicine suddenly except upon the advice of your doctor. Stopping this medicine too quickly may cause serious side effects or your condition may worsen. A special MedGuide will be given to you by the pharmacist with each prescription and refill. Be sure to read this information carefully each time. Talk to your pediatrician regarding the use of this medicine in children. While this drug may be prescribed for children as young as 7 years for selected conditions, precautions do apply. Overdosage: If you think you have taken too much of this medicine contact a poison control center or emergency room at once. NOTE: This medicine is only for you. Do not share this medicine with others. What if I miss a dose? If you  miss a dose, take it as soon as you can. If it is almost time for your next dose, take only that dose. Do not take double or extra doses. What may interact with this medicine? Do not take this medicine with any of the following medications:  cisapride  dronedarone  linezolid  MAOIs like Carbex, Eldepryl, Marplan, Nardil, and Parnate  methylene blue (injected into a  vein)  pimozide  thioridazine This medicine may also interact with the following medications:  alcohol  amphetamines  aspirin and aspirin-like medicines  certain medicines for depression, anxiety, or psychotic disturbances  certain medicines for fungal infections like ketoconazole, fluconazole, posaconazole, and itraconazole  certain medicines for irregular heart beat like flecainide, quinidine, propafenone  certain medicines for migraine headaches like almotriptan, eletriptan, frovatriptan, naratriptan, rizatriptan, sumatriptan, zolmitriptan  certain medicines for sleep  certain medicines for seizures like carbamazepine, valproic acid, phenytoin  certain medicines that treat or prevent blood clots like warfarin, enoxaparin, dalteparin  cimetidine  digoxin  diuretics  fentanyl  isoniazid  lithium  NSAIDs, medicines for pain and inflammation, like ibuprofen or naproxen  other medicines that prolong the QT interval (cause an abnormal heart rhythm) like dofetilide  rasagiline  safinamide  supplements like St. John's wort, kava kava, valerian  tolbutamide  tramadol  tryptophan This list may not describe all possible interactions. Give your health care provider a list of all the medicines, herbs, non-prescription drugs, or dietary supplements you use. Also tell them if you smoke, drink alcohol, or use illegal drugs. Some items may interact with your medicine. What should I watch for while using this medicine? Tell your doctor if your symptoms do not get better or if they get worse. Visit your doctor or health care professional for regular checks on your progress. Because it may take several weeks to see the full effects of this medicine, it is important to continue your treatment as prescribed by your doctor. Patients and their families should watch out for new or worsening thoughts of suicide or depression. Also watch out for sudden changes in feelings such as  feeling anxious, agitated, panicky, irritable, hostile, aggressive, impulsive, severely restless, overly excited and hyperactive, or not being able to sleep. If this happens, especially at the beginning of treatment or after a change in dose, call your health care professional. Bonita Quin may get drowsy or dizzy. Do not drive, use machinery, or do anything that needs mental alertness until you know how this medicine affects you. Do not stand or sit up quickly, especially if you are an older patient. This reduces the risk of dizzy or fainting spells. Alcohol may interfere with the effect of this medicine. Avoid alcoholic drinks. Your mouth may get dry. Chewing sugarless gum or sucking hard candy, and drinking plenty of water may help. Contact your doctor if the problem does not go away or is severe. What side effects may I notice from receiving this medicine? Side effects that you should report to your doctor or health care professional as soon as possible:  allergic reactions like skin rash, itching or hives, swelling of the face, lips, or tongue  anxious  black, tarry stools  changes in vision  confusion  elevated mood, decreased need for sleep, racing thoughts, impulsive behavior  eye pain  fast, irregular heartbeat  feeling faint or lightheaded, falls  feeling agitated, angry, or irritable  hallucination, loss of contact with reality  loss of balance or coordination  loss of memory  painful or prolonged erections  restlessness, pacing, inability to keep still  seizures  stiff muscles  suicidal thoughts or other mood changes  trouble sleeping  unusual bleeding or bruising  unusually weak or tired  vomiting Side effects that usually do not require medical attention (report to your doctor or health care professional if they continue or are bothersome):  change in appetite or weight  change in sex drive or performance  diarrhea  increased sweating  indigestion,  nausea  tremors This list may not describe all possible side effects. Call your doctor for medical advice about side effects. You may report side effects to FDA at 1-800-FDA-1088. Where should I keep my medicine? Keep out of the reach of children. Store at room temperature between 15 and 30 degrees C (59 and 86 degrees F). Throw away any unused medicine after the expiration date. NOTE: This sheet is a summary. It may not cover all possible information. If you have questions about this medicine, talk to your doctor, pharmacist, or health care provider.  2021 Elsevier/Gold Standard (2019-12-02 09:18:23)      SGLT-2  CI/SE avoid in A1C 9% - increased risk of UTIs/mycotic infections   Farxiga -  only one with primary and secondary CHF prevention, CKD protection Reduced hospitalizations - 10 mg dose only, NOT 5 mg Decreased cardiovascular death, secondary CVD OK to keep on down to GFR 25 Free commercial card, applies to deductible   Jardiance -  2ndary CVD death reduced 38% with established T2DM and CVD 2ndary CHF risk reduction 25% when added in addition to standard of care Improved coverage with commercial medicare - 97% coverage - tier 2 or 3 with medicare advantage plans GFR 30 for diabetes, GFR 20 if only for heart - medicare cost barriers - [email or web address], and/or [facility] - [phone]  Invokana -  Lowers risk of MI, CVD, CVA, death in T2DM with established vascular disease  Reduces CKD progression in CKD with albuminuria 300 GFR 30-60 100 mg daily dose, NOT 300 mg ^ risk of amputations  Steglatro - No CVD data but cheaper and applies to deductible    GLP-1  CI/SE Risk of thyroid tumors, multiple neoplasia endocrine (MENS) - don't give if family history  Acute pancreatitis, acute gallbladder dz - low threshold amylase/lipase/US for epigastric pain   Ozempic -  Reduced risk of major events in established CVD Greatest weight reduction ^ side  effect risk $25 with coupon - 1 mg pen x 6 /3 months, 0.5 mg 3 pens/3 months  Trulicity - Reduced CVD events in established disease  Next best, less weight loss, ? tolerated better  New doses up to 4.5%, A1C up to 3.3% down at max dose if starting 10+  $10 with copay card   Bydurion - Bcise -  No CVD indications free with commercial insurance  Victoza $10    [facility] - [phone] Trulicity, humalog/humulin

## 2020-04-18 ENCOUNTER — Other Ambulatory Visit: Payer: Self-pay | Admitting: Physician Assistant

## 2020-04-18 DIAGNOSIS — M62838 Other muscle spasm: Secondary | ICD-10-CM | POA: Diagnosis not present

## 2020-04-18 DIAGNOSIS — N941 Unspecified dyspareunia: Secondary | ICD-10-CM | POA: Diagnosis not present

## 2020-04-18 DIAGNOSIS — M6281 Muscle weakness (generalized): Secondary | ICD-10-CM | POA: Diagnosis not present

## 2020-04-18 LAB — CBC WITH DIFFERENTIAL/PLATELET
Absolute Monocytes: 713 cells/uL (ref 200–950)
Basophils Absolute: 57 cells/uL (ref 0–200)
Basophils Relative: 0.7 %
Eosinophils Absolute: 275 cells/uL (ref 15–500)
Eosinophils Relative: 3.4 %
HCT: 40.5 % (ref 35.0–45.0)
Hemoglobin: 13.6 g/dL (ref 11.7–15.5)
Lymphs Abs: 2819 cells/uL (ref 850–3900)
MCH: 29.8 pg (ref 27.0–33.0)
MCHC: 33.6 g/dL (ref 32.0–36.0)
MCV: 88.8 fL (ref 80.0–100.0)
MPV: 10.6 fL (ref 7.5–12.5)
Monocytes Relative: 8.8 %
Neutro Abs: 4236 cells/uL (ref 1500–7800)
Neutrophils Relative %: 52.3 %
Platelets: 219 10*3/uL (ref 140–400)
RBC: 4.56 10*6/uL (ref 3.80–5.10)
RDW: 13.1 % (ref 11.0–15.0)
Total Lymphocyte: 34.8 %
WBC: 8.1 10*3/uL (ref 3.8–10.8)

## 2020-04-18 LAB — COMPLETE METABOLIC PANEL WITH GFR
AG Ratio: 2.1 (calc) (ref 1.0–2.5)
ALT: 13 U/L (ref 6–29)
AST: 17 U/L (ref 10–30)
Albumin: 4.5 g/dL (ref 3.6–5.1)
Alkaline phosphatase (APISO): 93 U/L (ref 31–125)
BUN: 14 mg/dL (ref 7–25)
CO2: 29 mmol/L (ref 20–32)
Calcium: 9.5 mg/dL (ref 8.6–10.2)
Chloride: 103 mmol/L (ref 98–110)
Creat: 0.67 mg/dL (ref 0.50–1.10)
GFR, Est African American: 124 mL/min/{1.73_m2} (ref >=60)
GFR, Est Non African American: 107 mL/min/{1.73_m2} (ref >=60)
Globulin: 2.1 g/dL (calc) (ref 1.9–3.7)
Glucose, Bld: 78 mg/dL (ref 65–99)
Potassium: 4.1 mmol/L (ref 3.5–5.3)
Sodium: 139 mmol/L (ref 135–146)
Total Bilirubin: 0.3 mg/dL (ref 0.2–1.2)
Total Protein: 6.6 g/dL (ref 6.1–8.1)

## 2020-04-18 LAB — TSH: TSH: 1.04 mIU/L

## 2020-04-18 LAB — VITAMIN B12: Vitamin B-12: 362 pg/mL (ref 200–1100)

## 2020-04-25 DIAGNOSIS — N941 Unspecified dyspareunia: Secondary | ICD-10-CM | POA: Diagnosis not present

## 2020-04-25 DIAGNOSIS — R3911 Hesitancy of micturition: Secondary | ICD-10-CM | POA: Diagnosis not present

## 2020-04-25 DIAGNOSIS — M6281 Muscle weakness (generalized): Secondary | ICD-10-CM | POA: Diagnosis not present

## 2020-04-25 DIAGNOSIS — M62838 Other muscle spasm: Secondary | ICD-10-CM | POA: Diagnosis not present

## 2020-05-04 ENCOUNTER — Encounter: Payer: Self-pay | Admitting: Adult Health

## 2020-05-04 ENCOUNTER — Ambulatory Visit
Admission: RE | Admit: 2020-05-04 | Discharge: 2020-05-04 | Disposition: A | Discharge status: Home / Self Care | Payer: BC Managed Care – PPO | Source: Ambulatory Visit | Attending: Adult Health | Admitting: Adult Health

## 2020-05-04 ENCOUNTER — Ambulatory Visit (INDEPENDENT_AMBULATORY_CARE_PROVIDER_SITE_OTHER): Payer: BC Managed Care – PPO | Admitting: Adult Health

## 2020-05-04 ENCOUNTER — Other Ambulatory Visit: Payer: Self-pay

## 2020-05-04 VITALS — BP 118/80 | HR 56 | Temp 97.5°F | Wt 101.0 lb

## 2020-05-04 DIAGNOSIS — R058 Other specified cough: Secondary | ICD-10-CM | POA: Diagnosis not present

## 2020-05-04 DIAGNOSIS — Z1152 Encounter for screening for COVID-19: Secondary | ICD-10-CM

## 2020-05-04 DIAGNOSIS — J069 Acute upper respiratory infection, unspecified: Secondary | ICD-10-CM

## 2020-05-04 DIAGNOSIS — J209 Acute bronchitis, unspecified: Secondary | ICD-10-CM

## 2020-05-04 DIAGNOSIS — J440 Chronic obstructive pulmonary disease with acute lower respiratory infection: Secondary | ICD-10-CM

## 2020-05-04 LAB — POC COVID19 BINAXNOW: SARS Coronavirus 2 Ag: NEGATIVE

## 2020-05-04 MED ORDER — BREZTRI AEROSPHERE 160-9-4.8 MCG/ACT IN AERO: 2.0000 | INHALATION_SPRAY | Freq: Two times a day (BID) | RESPIRATORY_TRACT | 1 refills | Status: DC

## 2020-05-04 MED ORDER — MIRTAZAPINE 15 MG PO TABS
15.0000 mg | ORAL_TABLET | Freq: Every day | ORAL | 0 refills | Status: DC
Start: 2020-05-04 — End: 2020-05-26

## 2020-05-04 MED ORDER — PROMETHAZINE-DM 6.25-15 MG/5ML PO SYRP
5.0000 mL | ORAL_SOLUTION | Freq: Four times a day (QID) | ORAL | 1 refills | Status: DC | PRN
Start: 2020-05-04 — End: 2020-05-24

## 2020-05-04 MED ORDER — ALBUTEROL SULFATE HFA 108 (90 BASE) MCG/ACT IN AERS: 2.0000 | INHALATION_SPRAY | RESPIRATORY_TRACT | 0 refills | Status: DC | PRN

## 2020-05-04 MED ORDER — IPRATROPIUM-ALBUTEROL 0.5-2.5 (3) MG/3ML IN SOLN: 3.0000 mL | Freq: Once | RESPIRATORY_TRACT | Status: DC

## 2020-05-04 NOTE — Progress Notes (Signed)
Assessment and Plan:  Victoria Ashley was seen today for acute visit.  Diagnoses and all orders for this visit:  COPD with acute bronchitis (HCC) Bronchitis vs r/o pneumonia in current light smoker Negative covid 19 rapid  Improvement with duoneb in office  CXR r/o pneumonia, consider abx Encouraged smoking cessation Declined oral steroid taper; given coupon for breztri, sent in  The patient was advised to call immediately if she has any concerning symptoms in the interval. The patient voices understanding of current treatment options and is in agreement with the current care plan.The patient knows to call the clinic with any problems, questions or concerns or go to the ER if any further progression of symptoms.  -     Budeson-Glycopyrrol-Formoterol (BREZTRI AEROSPHERE) 160-9-4.8 MCG/ACT AERO; Inhale 2 puffs into the lungs in the morning and at bedtime. For COPD. Rinse mouth/brush teeth thoroughly after each use. -     ipratropium-albuterol (DUONEB) 0.5-2.5 (3) MG/3ML nebulizer solution 3 mL -     albuterol (VENTOLIN HFA) 108 (90 Base) MCG/ACT inhaler; Inhale 2 puffs into the lungs every 4 (four) hours as needed for wheezing or shortness of breath. Please give generic or the one that insurance covers  Encounter for screening for COVID-19 -     POC COVID-19 - negative  Productive cough -     DG Chest 2 View; Future  Other orders Note weight trending down further from last check; admits to poor appetitie with stress from twins; discssed Remeron and possible benefits; she does want to try; Reiterated appropriate calories, high protein/fat diet sent in new med with close follow up scheduled -     mirtazapine (REMERON) 15 MG tablet; Take 1 tablet (15 mg total) by mouth at bedtime. For appetite and weight gain.  Further disposition pending results of labs. Discussed med's effects and SE's.   Over 30 minutes of exam, counseling, chart review, and critical decision making was performed.   Future  Appointments  Date Time Provider Department Center  06/21/2020  3:30 PM Judd Gaudier, NP [facility] None  11/08/2020  2:00 PM McClanahan, Bella Kennedy, NP [facility] None    ------------------------------------------------------------------------------------------------------------------   HPI BP 118/80   Pulse (!) 56   Temp (!) 97.5 F (36.4 C)   Wt 101 lb (45.8 kg)   SpO2 98%   BMI 17.07 kg/m   45 y.o.female current light smoker with COPD presents for evaluation for 5 days of URI sx. She is vaccinated and boosted for covid 19. Negative covid 19 screen today.   She reports began with sore throat, fatigue, sense of fever/chills. She reports sore throat has improved, fatigue also improving but now with running nose/post- nasal drips and sense of settling into chest. She reports did start coughing on day 3. Mildly intermittently productive of thick phlegm.   She is current slight smoker (~3 cigs/day), COPD, inhalers only when she is sick. She has noted some wheezing but not short of breath. Hx of allergies in Spring and fall, taking zyrtec and flonase. Has added dayquil with limited benefit.   No past medical history on file.   No Known Allergies  Current Outpatient Medications on File Prior to Visit  Medication Sig  . ALPRAZolam (XANAX) 1 MG tablet TAKE 1/2 TABLET BY MOUTH TWICE A DAY AS NEEDED FOR ANXIETY  . baclofen (LIORESAL) 10 MG tablet Take 0.5-1 tablets (5-10 mg total) by mouth 3 (three) times daily as needed for muscle spasms.  . cyanocobalamin 1000 MCG tablet Take 1,000 mcg by  mouth daily.  Marland Kitchen gabapentin (NEURONTIN) 300 MG capsule Take 1 to 2 capsules at Bedtime for Restless Legs & Sleep  . ibuprofen (ADVIL) 800 MG tablet Take 1 tablet (800 mg total) by mouth every 8 (eight) hours as needed for cramping.  . sertraline (ZOLOFT) 100 MG tablet Take 1 tablet (100 mg total) by mouth daily.  Marland Kitchen VITAMIN D, CHOLECALCIFEROL, PO Take 5,000 Units by mouth.  . Vitamins/Minerals TABS Take by  mouth.   No current facility-administered medications on file prior to visit.    ROS: all negative except above.   Physical Exam:  BP 118/80   Pulse (!) 56   Temp (!) 97.5 F (36.4 C)   Wt 101 lb (45.8 kg)   SpO2 98%   BMI 17.07 kg/m   General Appearance: Thin adult female, well dressed, in no acute distress, appears feeling unwell.  Eyes: PERRLA, EOMs, conjunctiva no swelling or erythema Sinuses: No Frontal/maxillary tenderness ENT/Mouth: Ext aud canals clear, TMs without erythema, bulging. No erythema, swelling, or exudate on post pharynx.  Tonsils not swollen or erythematous. Hearing normal.  Neck: Supple Respiratory: Respiratory effort normal, BS present throughout bilaterally with scattered rales and rhonchi, clears some with cough, without wheezing or stridor. She demonstrates hacking bronchitic cough episode in office.  Cardio: RRR with no MRGs. Brisk peripheral pulses without edema.  Abdomen: Soft, + BS.  Non tender. Lymphatics: Non tender without lymphadenopathy.  Musculoskeletal: Full ROM, 5/5 strength, normal gait.  Skin: Warm, dry without rashes, lesions, ecchymosis.  Neuro: Normal muscle tone, no cerebellar symptoms. Sensation intact.  Psych: Awake and oriented X 3, normal affect, Insight and Judgment appropriate.     Dan Maker, NP 12:50 PM [facility]

## 2020-05-04 NOTE — Patient Instructions (Addendum)
Start breztri 2 puffs twice daily - rinse mouth/brush teeth after each use to avoid thrush  Albuterol inhaler as needed if any persistent wheezing  Continue allergy treatments  Please get chest xray today at [address] [facility] - can walk in without an appointment      Your bone density exam showed osteopenia which is the beginning of weakening of your bones, worst in your spine, very close to osteoporosis range.   Recommend Vitamin D and K2 supplement with high calcium diet (1200 mg/day), can add low dose calcium supplement if not getting enough through diet (300 mg once or twice daily); it is also VERY important to do impact or resistance exercises (weights, resistance bands, jumping, pilates, etc), at least every other day to help stimulate bone strengthening and prevent progression.   We will repeat the bone density test in 2 years.            Acute Bronchitis, Adult  Acute bronchitis is sudden or acute swelling of the air tubes (bronchi) in the lungs. Acute bronchitis causes these tubes to fill with mucus, which can make it hard to breathe. It can also cause coughing or wheezing. In adults, acute bronchitis usually goes away within 2 weeks. A cough caused by bronchitis may last up to 3 weeks. Smoking, allergies, and asthma can make the condition worse. What are the causes? This condition can be caused by germs and by substances that irritate the lungs, including:  Cold and flu viruses. The most common cause of this condition is the virus that causes the common cold.  Bacteria.  Substances that irritate the lungs, including: ? Smoke from cigarettes and other forms of tobacco. ? Dust and pollen. ? Fumes from chemical products, gases, or burned fuel. ? Other materials that pollute indoor or outdoor air.  Close contact with someone who has acute bronchitis. What increases the risk? The following factors may make you more likely to develop this  condition:  A weak body's defense system, also called the immune system.  A condition that affects your lungs and breathing, such as asthma. What are the signs or symptoms? Common symptoms of this condition include:  Lung and breathing problems, such as: ? Coughing. This may bring up clear, yellow, or green mucus from your lungs (sputum). ? Wheezing. ? Having too much mucus in your lungs (chest congestion). ? Having shortness of breath.  A fever.  Chills.  Aches and pains, including: ? Tightness in your chest and other body aches. ? A sore throat. How is this diagnosed? This condition is usually diagnosed based on:  Your symptoms and medical history.  A physical exam. You may also have other tests, including tests to rule out other conditions, such pneumonia. These tests include:  A test of lung function.  Test of a mucus sample to look for the presence of bacteria.  Tests to check the oxygen level in your blood.  Blood tests.  Chest X-ray. How is this treated? Most cases of acute bronchitis clear up over time without treatment. Your health care provider may recommend:  Drinking more fluids. This can thin your mucus, which may improve your breathing.  Taking a medicine for a fever or cough.  Using a device that gets medicine into your lungs (inhaler) to help improve breathing and control coughing.  Using a vaporizer or a humidifier. These are machines that add water to the air to help you breathe better. Follow these instructions at home: Activity  Get plenty of rest.  Return to your normal activities as told by your health care provider. Ask your health care provider what activities are safe for you. Lifestyle  Drink enough fluid to keep your urine pale yellow.  Do not drink alcohol.  Do not use any products that contain nicotine or tobacco, such as cigarettes, e-cigarettes, and chewing tobacco. If you need help quitting, ask your health care provider. Be  aware that: ? Your bronchitis will get worse if you smoke or breathe in other people's smoke (secondhand smoke). ? Your lungs will heal faster if you quit smoking. General instructions  Take over-the-counter and prescription medicines only as told by your health care provider.  Use an inhaler, vaporizer, or humidifier as told by your health care provider.  If you have a sore throat, gargle with a salt-water mixture 3-4 times a day or as needed. To make a salt-water mixture, completely dissolve -1 tsp (3-6 g) of salt in 1 cup (237 mL) of warm water.  Keep all follow-up visits as told by your health care provider. This is important.   How is this prevented? To lower your risk of getting this condition again:  Wash your hands often with soap and water. If soap and water are not available, use hand sanitizer.  Avoid contact with people who have cold symptoms.  Try not to touch your mouth, nose, or eyes with your hands.  Avoid places where there are fumes from chemicals. Breathing these fumes will make your condition worse.  Get the flu shot every year.   Contact a health care provider if:  Your symptoms do not improve after 2 weeks of treatment.  You vomit more than once or twice.  You have symptoms of dehydration such as: ? Dark urine. ? Dry skin or eyes. ? Increased thirst. ? Headaches. ? Confusion. ? Muscle cramps. Get help right away if you:  Cough up blood.  Feel pain in your chest.  Have severe shortness of breath.  Faint or keep feeling like you are going to faint.  Have a severe headache.  Have fever or chills that get worse. These symptoms may represent a serious problem that is an emergency. Do not wait to see if the symptoms will go away. Get medical help right away. Call your local emergency services (911 in the [place] Do not drive yourself to the hospital. Summary  Acute bronchitis is sudden (acute) inflammation of the air tubes (bronchi) between the  windpipe and the lungs. In adults, acute bronchitis usually goes away within 2 weeks, although coughing may last 3 weeks or longer  Take over-the-counter and prescription medicines only as told by your health care provider.  Drink enough fluid to keep your urine pale yellow.  Contact a health care provider if your symptoms do not improve after 2 weeks of treatment.  Get help right away if you cough up blood, faint, or have chest pain or shortness of breath. This information is not intended to replace advice given to you by your health care provider. Make sure you discuss any questions you have with your health care provider. Document Revised: 10/05/2018 Document Reviewed: 08/14/2018 Elsevier Patient Education  2021 Elsevier Inc.   Budesonide; Glycopyrrolate; Formoterol inhalation aerosol What is this medicine? BUDESONIDE; GLYCOPYRROLATE; FORMOTEROL (byoo DES oh nide; glye koe PYE roe late; for [facility] te rol) inhalation is a combination of 3 medicines. Budesonide decreases inflammation in the lungs. Formoterol and glycopyrrolate are bronchodilators that help keep airways open.  This medicine is used to treat chronic obstructive pulmonary disease (COPD). Do not use this drug combination for acute COPD attacks or bronchospasm. This medicine may be used for other purposes; ask your health care provider or pharmacist if you have questions. COMMON BRAND NAME(S): BREZTRI What should I tell my health care provider before I take this medicine? They need to know if you have any of these conditions:  bladder problems or trouble passing urine  bone problems  diabetes  eye disease, vision problems  heart disease  high blood pressure  history of irregular heartbeat  immune system problems  infection  kidney disease  pheochromocytoma  prostate disease  seizures  thyroid disease  an unusual or allergic reaction to budesonide, formoterol, glycopyrrolate, medicines, foods, dyes, or  preservatives  pregnant or trying to get pregnant  breast-feeding How should I use this medicine? This medicine is inhaled through the mouth. Follow the directions on the prescription label. Shake well before each use. Rinse your mouth with water after use. Make sure not to swallow the water. Take your medicine at regular intervals. Do not take your medicine more often than directed. Do not stop taking except on your doctor's advice. Make sure that you are using your inhaler correctly. This drug comes with INSTRUCTIONS FOR USE. Ask your pharmacist for directions on how to use this drug. Read the information carefully. Talk to your pharmacist or health care provider if you have questions. Talk to your pediatrician regarding the use of this medicine in children. This medicine is not approved for use in children. Overdosage: If you think you have taken too much of this medicine contact a poison control center or emergency room at once. NOTE: This medicine is only for you. Do not share this medicine with others. What if I miss a dose? If you miss a dose, use it as soon as you can. If it is almost time for your next dose, use only that dose. Do not use double or extra doses. What may interact with this medicine? Do not take the medicine with any of the following medications:  cisapride  dofetilide  dronedarone  MAOIs like Marplan, Nardil, and Parnate  other medicines that contain long-acting beta-2 agonists (LABAs) like arformoterol, formoterol, indacaterol, olodaterol, salmeterol, vilanterol  pimozide  procarbazine  thioridazine This medicine may also interact with the following medications:  antihistamines for allergy, cough, and cold  atropine  certain antibiotics like clarithromycin, telithromycin  certain antivirals for HIV or hepatitis  certain heart medicines like atenolol, metoprolol  certain medicines for bladder problems like oxybutynin, tolterodine  certain  medicines for blood pressure, heart disease, irregular heartbeat  certain medicines for depression, anxiety, or psychotic disturbances  certain medicines for fungal infections like ketoconazole, itraconazole  certain medicines for Parkinson's disease like benztropine, trihexyphenidyl  certain medicines for stomach problems like dicyclomine, hyoscyamine  certain medicines for travel sickness like scopolamine  diuretics  grapefruit juice  mifepristone  other inhaled medicines that contain anticholinergics such as aclidinium, ipratropium, tiotropium, umeclidinium  other medicines that prolong the QT interval (an abnormal heart rhythm)  some vaccines  steroid medicines like prednisone or cortisone  stimulant medicines for attention disorders, weight loss, or to stay awake  theophylline This list may not describe all possible interactions. Give your health care provider a list of all the medicines, herbs, non-prescription drugs, or dietary supplements you use. Also tell them if you smoke, drink alcohol, or use illegal drugs. Some items may interact with your medicine.  What should I watch for while using this medicine? Visit your health care professional for regular checks on your progress. Tell your health care professional if your symptoms do not start to get better or if they get worse. NEVER use this medicine for an acute asthma or COPD attack. You should use your short-acting rescue inhalers for this purpose. If your symptoms get worse or if you need your short-acting inhalers more often, call your doctor right away. This medicine may increase your risk of getting an infection. Tell your doctor or health care professional if you are around anyone with measles or chickenpox, or if you develop sores or blisters that do not heal properly. Do not get this medicine in your eyes. It can cause irritation, pain, or blurred vision. What side effects may I notice from receiving this  medicine? Side effects that you should report to your doctor or health care professional as soon as possible:  allergic reactions like skin rash, itching or hives, swelling of the face, lips, or tongue  anxious  breathing problems  changes in vision, eye pain  muscle cramps or muscle pain  signs and symptoms of a dangerous change in heartbeat or heart rhythm like chest pain; dizziness; fast or irregular heartbeat; palpitations; feeling faint or lightheaded, falls; breathing problems  signs and symptoms of high blood sugar such as being more thirsty or hungry or having to urinate more than normal. You may also feel very tired or have blurry vision  signs and symptoms of infection like fever; chills; cough; sore throat; pain or trouble passing urine  tremors  trouble passing urine or change in the amount of urine  unusually weak or tired  white patches in the mouth or mouth sores Side effects that usually do not require medical attention (report these to your doctor or health care professional if they continue or are bothersome):  back pain  changes in taste  cough  diarrhea  runny or stuffy nose  upset stomach This list may not describe all possible side effects. Call your doctor for medical advice about side effects. You may report side effects to FDA at 1-800-FDA-1088. Where should I keep my medicine? Keep out of the reach of children. Store in a dry place at room temperature between 15 and 30 degrees C (59 and 86 degrees F). Protect from heat. Do not freeze. Do not use or store near heat or flame, as the canister may burst. Throw away the inhaler 3 months after you open the foil pouch (for the 120-inhalation canister), or 3 weeks after you open the foil pouch (for the 28-inhalation canister), or when the dose indicator reaches zero "0", whichever comes first. NOTE: This sheet is a summary. It may not cover all possible information. If you have questions about this  medicine, talk to your doctor, pharmacist, or health care provider.  2021 Elsevier/Gold Standard (2018-09-01 18:58:00)

## 2020-05-09 DIAGNOSIS — M6281 Muscle weakness (generalized): Secondary | ICD-10-CM | POA: Diagnosis not present

## 2020-05-09 DIAGNOSIS — M62838 Other muscle spasm: Secondary | ICD-10-CM | POA: Diagnosis not present

## 2020-05-09 DIAGNOSIS — N941 Unspecified dyspareunia: Secondary | ICD-10-CM | POA: Diagnosis not present

## 2020-05-09 DIAGNOSIS — R3911 Hesitancy of micturition: Secondary | ICD-10-CM | POA: Diagnosis not present

## 2020-05-24 ENCOUNTER — Other Ambulatory Visit: Payer: Self-pay

## 2020-05-24 ENCOUNTER — Encounter: Payer: Self-pay | Admitting: Family Medicine

## 2020-05-24 ENCOUNTER — Ambulatory Visit: Payer: BC Managed Care – PPO | Admitting: Family Medicine

## 2020-05-24 DIAGNOSIS — M5442 Lumbago with sciatica, left side: Secondary | ICD-10-CM

## 2020-05-24 MED ORDER — OXYCODONE-ACETAMINOPHEN 5-325 MG PO TABS: 1.0000 | ORAL_TABLET | Freq: Four times a day (QID) | ORAL | 0 refills | Status: DC | PRN

## 2020-05-24 MED ORDER — BACLOFEN 10 MG PO TABS: 5.0000 mg | ORAL_TABLET | Freq: Three times a day (TID) | ORAL | 3 refills | Status: DC | PRN

## 2020-05-24 NOTE — Progress Notes (Signed)
Office Visit Note   Patient: Victoria Ashley           Date of Birth: 10/07/1975           MRN: [id number] Visit Date: 05/24/2020 Requested by: Lucky Cowboy, MD [address] [place],  [place] [address] PCP: Lucky Cowboy, MD  Subjective: Chief Complaint  Patient presents with  . Lower Back - Pain    Pain in lower back has slowly started coming back, but has been worse since traveling to [place] and back (just got back 2 days ago). Pain is bilateral and radiating down the posterior  left leg to the knee. Today, the right side of her back is hurting a bit more than the left.    HPI: She is here with a flareup of low back pain.  She did very well with epidural injection last year.  She was doing fine until the past week or so.  While in [place], she leaned forward to kiss her daughter and felt a sudden flareup of pain.  Pain shooting down the left leg to the knee.  Very uncomfortable, cannot find a position where she is without pain.-Ibuprofen 800 has not helped.  No bowel or bladder dysfunction.  She would like another epidural injection if possible.              ROS:   All other systems were reviewed and are negative.  Objective: Vital Signs: There were no vitals taken for this visit.  Physical Exam:  General:  Alert and oriented, in no acute distress. Pulm:  Breathing unlabored. Psy:  Normal mood, congruent affect.  Low back: She is tender to palpation in the upper lumbar paraspinous muscles.  Also tender in the left sciatic notch.  Straight leg raise is equivocal, lower extremity strength and reflexes remain normal.  Imaging: No results found.  Assessment & Plan: 1.  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 -Referral for another epidural injection, probably at L2-3.  Percocet and baclofen refilled to take as needed.     Procedures: No procedures performed        PMFS History: Patient Active Problem List    Diagnosis Date Noted  . BMI less than 19,adult 04/17/2020  . Chronic left-sided low back pain with left-sided sciatica 11/08/2019  . Post-menopausal 11/11/2018  . Vitamin D deficiency 10/02/2018  . Migraine without status migrainosus, not intractable 10/02/2018  . COPD (chronic obstructive pulmonary disease) (HCC) 10/31/2017  . Osteopenia 03/21/2017  . Depression, major, recurrent, in partial remission (HCC) 09/18/2016  . RLS (restless legs syndrome) 09/18/2016  . Insomnia 09/18/2016  . Genetic testing 02/16/2016  . GAD (generalized anxiety disorder) 12/15/2015  . Cigarette nicotine dependence without complication 12/15/2015  . History of colitis 12/15/2014   History reviewed. No pertinent past medical history.  Family History  Problem Relation Age of Onset  . Cancer Mother 76       breast cancer  . Breast cancer Mother   . Diabetes Father 48  . Alcohol abuse Father   . Cancer Maternal Grandmother 51       ovarian  . Heart disease Maternal Grandfather     History reviewed. No pertinent surgical history. Social History   Occupational History  . Not on file  Tobacco Use  . Smoking status: Current Every Day Smoker    Packs/day: 0.25    Years: 20.00    Pack years: 5.00    Types:  Cigarettes  . Smokeless tobacco: Never Used  Vaping Use  . Vaping Use: Never used  Substance and Sexual Activity  . Alcohol use: No    Alcohol/week: 0.0 standard drinks  . Drug use: No  . Sexual activity: Yes    Birth control/protection: None    Comment: post menopausal

## 2020-05-26 ENCOUNTER — Telehealth: Payer: Self-pay

## 2020-05-26 ENCOUNTER — Other Ambulatory Visit: Payer: Self-pay | Admitting: Adult Health

## 2020-05-26 ENCOUNTER — Other Ambulatory Visit: Payer: Self-pay

## 2020-05-26 ENCOUNTER — Ambulatory Visit
Admission: RE | Admit: 2020-05-26 | Discharge: 2020-05-26 | Disposition: A | Discharge status: Home / Self Care | Payer: BC Managed Care – PPO | Source: Ambulatory Visit | Attending: Family Medicine | Admitting: Family Medicine

## 2020-05-26 DIAGNOSIS — M5442 Lumbago with sciatica, left side: Secondary | ICD-10-CM

## 2020-05-26 DIAGNOSIS — M47817 Spondylosis without myelopathy or radiculopathy, lumbosacral region: Secondary | ICD-10-CM | POA: Diagnosis not present

## 2020-05-26 MED ORDER — IOPAMIDOL (ISOVUE-M 200) INJECTION 41%
1.0000 mL | Freq: Once | INTRAMUSCULAR | Status: AC
  Administered 2020-05-26: 1 mL via EPIDURAL

## 2020-05-26 MED ORDER — METHYLPREDNISOLONE ACETATE 40 MG/ML INJ SUSP (RADIOLOG
80.0000 mg | Freq: Once | INTRAMUSCULAR | Status: AC
  Administered 2020-05-26: 80 mg via EPIDURAL

## 2020-05-26 NOTE — Discharge Instructions (Signed)

## 2020-05-26 NOTE — Telephone Encounter (Signed)
Patient called regarding vm that was left call [phone]

## 2020-05-29 ENCOUNTER — Other Ambulatory Visit: Payer: BC Managed Care – PPO

## 2020-05-29 NOTE — Telephone Encounter (Signed)
Called pt and LVM #1, Pt referral was close due to she receiving inj at [place] in three days ago.

## 2020-06-05 ENCOUNTER — Other Ambulatory Visit: Payer: Self-pay | Admitting: Adult Health

## 2020-06-05 ENCOUNTER — Telehealth: Payer: Self-pay

## 2020-06-05 MED ORDER — ALPRAZOLAM 1 MG PO TABS: ORAL_TABLET | ORAL | 0 refills | Status: DC

## 2020-06-05 NOTE — Telephone Encounter (Signed)
Refill request for Alprazolam 

## 2020-06-05 NOTE — Progress Notes (Signed)
Future Appointments  Date Time Provider Department Center  06/21/2020  3:30 PM Judd Gaudier, NP [facility] None  11/08/2020  2:00 PM Elder Negus, NP [facility] None    PDMP reviewed for xanax refill request.

## 2020-06-06 DIAGNOSIS — R3911 Hesitancy of micturition: Secondary | ICD-10-CM | POA: Diagnosis not present

## 2020-06-06 DIAGNOSIS — M6281 Muscle weakness (generalized): Secondary | ICD-10-CM | POA: Diagnosis not present

## 2020-06-06 DIAGNOSIS — M62838 Other muscle spasm: Secondary | ICD-10-CM | POA: Diagnosis not present

## 2020-06-06 DIAGNOSIS — N941 Unspecified dyspareunia: Secondary | ICD-10-CM | POA: Diagnosis not present

## 2020-06-20 NOTE — Progress Notes (Deleted)
Assessment and Plan:  Victoria Ashley was seen today for fatigue.  Diagnoses and all orders for this visit:  Hair loss Generalized, doesn't appear scarring at this time, normal scalp. 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  Check labs Work to increase oral intake and nutritional status, stress management reviewed Monitor; follow up if not improving in 6-8 weeks consider derm referral  -     CBC with Differential/Platelet -     COMPLETE METABOLIC PANEL WITH GFR -     TSH  Weight loss -     TSH  Fatigue, unspecified type -     CBC with Differential/Platelet -     TSH -     Vitamin B12  B12 deficiency -     Vitamin B12  GAD (generalized anxiety disorder) Trying alternate agent to evaluate response Stop celexa, start zoloft 100 mg daily; has needed high doses in the past; if tolerating well but inadequate response try taper up to 150 mg then 200 mg/day if needed.  -     sertraline (ZOLOFT) 100 MG tablet; Take 1 tablet (100 mg total) by mouth daily.  BMI less than 19,adult She attributes weight loss to stress and consequent poor appetite; reports improving; working to increase intake, protein supplement Goal to get back to baseline 110lb  Further disposition pending results of labs. Discussed med's effects and SE's.   Over 30 minutes of exam, counseling, chart review, and critical decision making was performed.   Future Appointments  Date Time Provider Department Center  06/21/2020  3:30 PM Judd Gaudier, NP [facility] None  11/08/2020  2:00 PM McClanahan, Bella Kennedy, NP [facility] None   ------------------------------------------------------------------------------------------------------------------   HPI 45 y.o.female with GAD, presents for evaluation of hair loss. She is new to me; Victoria Ashley, boy and girl.   She reports in the last 2 months has noted rapid hair loss, generalized, when she would brush excessive amount coming out,  also noting drain clogging. She has not noted hair breaking, scalp irritation/itching. Denies skin changes. She does note fatigue, but no changes in this time period, attributes to parenthood. Denies constipation.   Low BMI -    BMI is There is no height or weight on file to calculate BMI., she has been working on diet and exercise. Admits nausea and poor oral intake with stress, has had signicantly this past year with adopted Ashley. Reports this is actually improving. She reports had gained 4 lb, but recently had vomiting/diarrhea x 24 hours after eating seafood and working to regain. Have added collagen/protein supplement. Baseline weight around 110 lb, if working out will gain up to 113 lb.   Does have long hx of GAD, has been on prozac, lexapro, currently celexa 40 mg daily; has also tried effexor, trazodone in the past; would like to try something else to see if better response. Was given remeron *** Wt Readings from Last 3 Encounters:  05/04/20 101 lb (45.8 kg)  04/17/20 102 lb (46.3 kg)  11/09/19 111 lb (50.3 kg)   No hx of thyroid abnormality Lab Results  Component Value Date   TSH 1.04 04/17/2020   Does have hx of B12 def, has been off of supplement for some time Lab Results  Component Value Date   VITAMINB12 362 04/17/2020      No past medical history on file.   No Known Allergies  Current Outpatient Medications on File Prior to Visit  Medication Sig  . ALPRAZolam (  XANAX) 1 MG tablet TAKE 1/2-1 TABLET BY MOUTH TWICE A DAY AS NEEDED FOR ANXIETY. DO NOT TAKE DAILY TO AVOID BUILDING TOLERANCE.  . baclofen (LIORESAL) 10 MG tablet Take 0.5-1 tablets (5-10 mg total) by mouth 3 (three) times daily as needed for muscle spasms.  . cyanocobalamin 1000 MCG tablet Take 1,000 mcg by mouth daily.  Marland Kitchen estradiol-levonorgestrel (CLIMARA PRO) 0.045-0.015 MG/DAY Climara Pro 0.045 mg-0.015 mg/24 hr transdermal patch  APPLY 1 PATCH BY TRANSDERMAL ROUTE EVERY WEEK  . ibuprofen (ADVIL) 800 MG  tablet Take 1 tablet (800 mg total) by mouth every 8 (eight) hours as needed for cramping.  . mirtazapine (REMERON) 15 MG tablet Take  1 tablet  at Bedtime  for Appetite & Weight Gain  . oxyCODONE-acetaminophen (PERCOCET/ROXICET) 5-325 MG tablet Take 1 tablet by mouth every 6 (six) hours as needed for severe pain.  Marland Kitchen sertraline (ZOLOFT) 100 MG tablet Take 1 tablet (100 mg total) by mouth daily.  Marland Kitchen VITAMIN D, CHOLECALCIFEROL, PO Take 5,000 Units by mouth.  . Vitamins/Minerals TABS Take by mouth.   Current Facility-Administered Medications on File Prior to Visit  Medication  . ipratropium-albuterol (DUONEB) 0.5-2.5 (3) MG/3ML nebulizer solution 3 mL    ROS: all negative except above.   Physical Exam:  There were no vitals taken for this visit.  General Appearance: Well dressed, well groomed very thin adult female, in no apparent distress. Eyes: PERRLA, conjunctiva no swelling or erythema ENT/Mouth: No erythema, swelling, or exudate on post pharynx.  Tonsils not swollen or erythematous. Hearing normal.  Neck: Supple, thyroid normal.  Respiratory: Respiratory effort normal, BS equal bilaterally without rales, rhonchi, wheezing or stridor.  Cardio: RRR with no MRGs. Brisk peripheral pulses without edema.  Abdomen: Soft, + BS.  Non tender, no guarding, rebound, hernias, masses. Lymphatics: Non tender without lymphadenopathy.  Musculoskeletal: no obvious deformity, normal gait.  Skin: Warm, dry without rashes, lesions, ecchymosis. Scalp with generalized thinning of hair, generalized, mild. No patching/scarring, no erythema, flaking.  Neuro: Normal muscle tone Psych: Awake and oriented X 3, normal affect, Insight and Judgment appropriate.     Dan Maker, NP 10:36 AM [facility]

## 2020-06-21 ENCOUNTER — Ambulatory Visit: Payer: BC Managed Care – PPO | Admitting: Adult Health

## 2020-06-21 DIAGNOSIS — M8580 Other specified disorders of bone density and structure, unspecified site: Secondary | ICD-10-CM

## 2020-06-21 DIAGNOSIS — Z681 Body mass index (BMI) 19 or less, adult: Secondary | ICD-10-CM

## 2020-06-21 DIAGNOSIS — F17210 Nicotine dependence, cigarettes, uncomplicated: Secondary | ICD-10-CM

## 2020-06-21 DIAGNOSIS — G4700 Insomnia, unspecified: Secondary | ICD-10-CM

## 2020-06-21 DIAGNOSIS — F3341 Major depressive disorder, recurrent, in partial remission: Secondary | ICD-10-CM

## 2020-07-21 ENCOUNTER — Other Ambulatory Visit: Payer: Self-pay | Admitting: Adult Health

## 2020-07-21 DIAGNOSIS — F411 Generalized anxiety disorder: Secondary | ICD-10-CM

## 2020-08-09 ENCOUNTER — Ambulatory Visit: Payer: BC Managed Care – PPO | Admitting: Adult Health

## 2020-08-09 ENCOUNTER — Encounter: Payer: Self-pay | Admitting: Adult Health

## 2020-08-09 ENCOUNTER — Other Ambulatory Visit: Payer: Self-pay

## 2020-08-09 VITALS — BP 110/72 | HR 77 | Temp 97.3°F | Wt 121.0 lb

## 2020-08-09 DIAGNOSIS — E538 Deficiency of other specified B group vitamins: Secondary | ICD-10-CM

## 2020-08-09 DIAGNOSIS — Z6820 Body mass index (BMI) 20.0-20.9, adult: Secondary | ICD-10-CM

## 2020-08-09 DIAGNOSIS — R5383 Other fatigue: Secondary | ICD-10-CM

## 2020-08-09 DIAGNOSIS — Z681 Body mass index (BMI) 19 or less, adult: Secondary | ICD-10-CM

## 2020-08-09 DIAGNOSIS — G4700 Insomnia, unspecified: Secondary | ICD-10-CM

## 2020-08-09 DIAGNOSIS — M8580 Other specified disorders of bone density and structure, unspecified site: Secondary | ICD-10-CM

## 2020-08-09 DIAGNOSIS — E559 Vitamin D deficiency, unspecified: Secondary | ICD-10-CM

## 2020-08-09 DIAGNOSIS — F411 Generalized anxiety disorder: Secondary | ICD-10-CM

## 2020-08-09 DIAGNOSIS — Z6822 Body mass index (BMI) 22.0-22.9, adult: Secondary | ICD-10-CM | POA: Insufficient documentation

## 2020-08-09 DIAGNOSIS — F3341 Major depressive disorder, recurrent, in partial remission: Secondary | ICD-10-CM | POA: Diagnosis not present

## 2020-08-09 MED ORDER — ALENDRONATE SODIUM 70 MG PO TABS: 70.0000 mg | ORAL_TABLET | ORAL | 3 refills | Status: DC

## 2020-08-09 NOTE — Patient Instructions (Addendum)
Smoothies  Have a list of healthy go options - post on fridge  Try half dose of remeron at night - or take every other night   ? Nanny to start helping out  Schedule something to look forward to in the short term   Barre class, brunch with friends  Remember end is in site - have a date planned for daycare   Keep a log - see if you can tell what causes more fatigue vs not  Sounds like you are overwhelmed -   Consider therapist/counseling - may help with effective communication with husband  Consider making a list of activities - consider ways to offload some responsibilities  List for things husband can help with - be specific - put kids to bed on MWF, etc.   Consider house cleaner  Meal delivery so you don't have to decide what to eat, etc     Alendronate Weekly Tablets What is this medication? ALENDRONATE (a LEN droe nate) treats osteoporosis. It works by Interior and spatial designer stronger and less likely to break (fracture). It belongs to a group ofmedications called bisphosphonates. This medicine may be used for other purposes; ask your health care provider orpharmacist if you have questions. COMMON BRAND NAME(S): Fosamax What should I tell my care team before I take this medication? They need to know if you have any of these conditions: Bleeding disorder Cancer Dental disease Difficulty swallowing Infection (fever, chills, cough, sore throat, pain or trouble passing urine) Kidney disease Low levels of calcium or other minerals in the blood Low red blood cell counts Receiving steroids like dexamethasone or prednisone Stomach or intestine problems Trouble sitting or standing for 30 minutes An unusual or allergic reaction to alendronate, other medications, foods, dyes or preservatives Pregnant or trying to get pregnant Breast-feeding How should I use this medication? Take this medication by mouth with a full glass of water. Take it as directed on the prescription  label at the same day of each week. Take the dose right after waking up. Do not eat or drink anything before taking it. Do not take it with any other drink except water. Do not chew or crush the tablet. After taking it, do not eat breakfast, drink, or take any other medications or vitamins for at least 30 minutes. Sit or stand up for at least 30 minutes after you take it. Do not lie down. Keep taking it unless your care team tells you tostop. A special MedGuide will be given to you by the pharmacist with eachprescription and refill. Be sure to read this information carefully each time. Talk to your care team about the use of this medication in children. Specialcare may be needed. Overdosage: If you think you have taken too much of this medicine contact apoison control center or emergency room at once. NOTE: This medicine is only for you. Do not share this medicine with others. What if I miss a dose? If you take your medication once a day, skip it. Take your next dose at thescheduled time the next morning. Do not take two doses on the same day. If you take your medication once a week, take the missed dose on the morningafter you remember. Do not take two doses on the same day. What may interact with this medication? Aluminum hydroxide Antacids Aspirin Calcium supplements Iron supplements Medications for inflammation like ibuprofen, naproxen, and others Magnesium supplements Vitamins with minerals This list may not describe all possible interactions. Give your health care provider  a list of all the medicines, herbs, non-prescription drugs, or dietary supplements you use. Also tell them if you smoke, drink alcohol, or use illegaldrugs. Some items may interact with your medicine. What should I watch for while using this medication? Visit your care team for regular checks on your progress. It may be some timebefore you see the benefit from this medication. Some people who take this medication have  severe bone, joint, or muscle pain. This medication may also increase your risk for jaw problems or a broken thigh bone. Tell your care team right away if you have severe pain in your jaw, bones, joints, or muscles. Tell you care team if you have any pain that doesnot go away or that gets worse. Tell your dentist and dental surgeon that you are taking this medication. You should not have major dental surgery while on this medication. See your dentist to have a dental exam and fix any dental problems before starting this medication. Take good care of your teeth while on this medication. Make sureyou see your dentist for regular follow-up appointments. You should make sure you get enough calcium and vitamin D while you are taking this medication. Discuss the foods you eat and the vitamins you take with yourcare team. You may need blood work done while you are taking this medication. What side effects may I notice from receiving this medication? Side effects that you should report to your care team as soon as possible: Allergic reactions-skin rash, itching, hives, swelling of the face, lips, tongue, or throat Low calcium level-muscle pain or cramps, confusion, tingling, or numbness in the hands or feet Osteonecrosis of the jaw-pain, swelling, or redness in the mouth, numbness of the jaw, poor healing after dental work, unusual discharge from the mouth, visible bones in the mouth Pain or trouble swallowing Severe bone, joint, or muscle pain Stomach bleeding-bloody or black, tar-like stools, vomiting blood or brown material that looks like coffee grounds Side effects that usually do not require medical attention (report to your careteam if they continue or are bothersome): Constipation Diarrhea Nausea Stomach pain This list may not describe all possible side effects. Call your doctor for medical advice about side effects. You may report side effects to FDA at1-800-FDA-1088. Where should I keep my  medication? Keep out of the reach of children and pets. Store at room temperature between 15 and 30 degrees C (59 and 86 degrees F).Throw away any unused medication after the expiration date. NOTE: This sheet is a summary. It may not cover all possible information. If you have questions about this medicine, talk to your doctor, pharmacist, orhealth care provider.  2022 Elsevier/Gold Standard (2019-12-14 13:53:40)

## 2020-08-09 NOTE — Progress Notes (Signed)
Assessment and Plan:   Victoria Ashley was seen today for follow-up.  Diagnoses and all orders for this visit:  GAD (generalized anxiety disorder) Depression, major, recurrent, in partial remission (HCC) Insomnia, unspecified type Well managed by current regimen; continue medications Can try 1/2 tab remeron or every other day Stress management techniques discussed, increase water, good sleep hygiene discussed, increase exercise, and increase veggies.   Osteopenia, unspecified location - get dexa q2y, discussed Vit D and Ca, weight bearing exercises - high risk, patient preference to start fosamax after discussion -     alendronate (FOSAMAX) 70 MG tablet; Take 1 tablet (70 mg total) by mouth every 7 (seven) days. Take with a full glass of water on an empty stomach.  Vitamin D deficiency Fatigue, unspecified type -     CBC with Differential/Platelet -     Vitamin B12 -     VITAMIN D 25 Hydroxy (Vit-D Deficiency, Fractures) -     Iron, TIBC and Ferritin Panel  BMI 20.0-20.9, adult Improved with remeron Continue to recommend diet heavy in fruits and veggies and low in animal meats, cheeses, and dairy products, appropriate calorie intake Discuss exercise recommendations routinely Continue to monitor weight at each visit   Further disposition pending results of labs. Discussed med's effects and SE's.   Over 30 minutes of exam, counseling, chart review, and critical decision making was performed.   Future Appointments  Date Time Provider Department Center  11/08/2020  2:00 PM McClanahan, Bella Kennedy, NP [facility] None   ------------------------------------------------------------------------------------------------------------------   HPI 45 y.o.female with GAD, major depression, insomnia, very low body weight, vitamin D def and osteoporosis. She is an adoptive mother of 71 month old twins, boy and girl.   She has GAD and MDD, has been on prozac, lexapro, celexa, effexor, trazodone in the past.  Has required high doses for benefit. Most recently on sertraline 100 mg daily,  also added Remeron due to severely low body weight and sleep. She notes significant improvement with this plan, states mood is doing much better. However, she continues to note significant fatigue, intermittently. With extended discussion sounds like mental fatigue, adoptive mother of twins, 16 months, no longer has help from [place] or mom.   BMI is Body mass index is 20.45 kg/m., she has gained 20 lb with addition of remeron, much improved mood and appetite. Admits not always making good choices, reviewed strategies and options. She reports slushies, milk shakes, nutty bars Wt Readings from Last 3 Encounters:  08/09/20 121 lb (54.9 kg)  05/04/20 101 lb (45.8 kg)  04/17/20 102 lb (46.3 kg)   TSH has been normal  Lab Results  Component Value Date   TSH 1.04 04/17/2020   DEXA 04/14/2020 showed spine T -2.4. Discussed alendronate.   Patient is on Vitamin D supplement, increased after last check  Lab Results  Component Value Date   VD25OH 77 11/09/2019     She is on a supplement, has increased dose  Lab Results  Component Value Date   VITAMINB12 362 04/17/2020    Past Medical History:  Diagnosis Date   BMI less than 19,adult 04/17/2020     No Known Allergies  Current Outpatient Medications on File Prior to Visit  Medication Sig   ALPRAZolam (XANAX) 1 MG tablet TAKE 1/2-1 TABLET BY MOUTH TWICE A DAY AS NEEDED FOR ANXIETY. DO NOT TAKE DAILY TO AVOID BUILDING TOLERANCE.   baclofen (LIORESAL) 10 MG tablet Take 0.5-1 tablets (5-10 mg total) by mouth 3 (three) times daily  as needed for muscle spasms.   cyanocobalamin 1000 MCG tablet Take 1,000 mcg by mouth daily.   ESTRADIOL PO Take by mouth daily.   ibuprofen (ADVIL) 800 MG tablet Take 1 tablet (800 mg total) by mouth every 8 (eight) hours as needed for cramping.   mirtazapine (REMERON) 15 MG tablet Take  1 tablet  at Bedtime  for Appetite & Weight Gain    sertraline (ZOLOFT) 100 MG tablet TAKE 1 TABLET BY MOUTH EVERY DAY   VITAMIN D, CHOLECALCIFEROL, PO Take 5,000 Units by mouth.   estradiol-levonorgestrel (CLIMARA PRO) 0.045-0.015 MG/DAY Climara Pro 0.045 mg-0.015 mg/24 hr transdermal patch  APPLY 1 PATCH BY TRANSDERMAL ROUTE EVERY WEEK (Patient not taking: Reported on 08/09/2020)   oxyCODONE-acetaminophen (PERCOCET/ROXICET) 5-325 MG tablet Take 1 tablet by mouth every 6 (six) hours as needed for severe pain. (Patient not taking: Reported on 08/09/2020)   Vitamins/Minerals TABS Take by mouth. (Patient not taking: Reported on 08/09/2020)   Current Facility-Administered Medications on File Prior to Visit  Medication   ipratropium-albuterol (DUONEB) 0.5-2.5 (3) MG/3ML nebulizer solution 3 mL    ROS: all negative except above.   Physical Exam:  BP 110/72   Pulse 77   Temp (!) 97.3 F (36.3 C)   Wt 121 lb (54.9 kg)   SpO2 98%   BMI 20.45 kg/m   General Appearance: Well dressed, well groomed thin adult female, in no apparent distress. Eyes: PERRLA, conjunctiva no swelling or erythema ENT/Mouth: No erythema, swelling, or exudate on post pharynx.  Tonsils not swollen or erythematous. Hearing normal.  Neck: Supple, thyroid normal.  Respiratory: Respiratory effort normal, BS equal bilaterally without rales, rhonchi, wheezing or stridor.  Cardio: RRR with no MRGs. Brisk peripheral pulses without edema.  Abdomen: Soft, + BS.  Non tender, no guarding, rebound, hernias, masses. Lymphatics: Non tender without lymphadenopathy.  Musculoskeletal: no obvious deformity, normal gait.  Skin: Warm, dry without rashes, lesions, ecchymosis.  Neuro: Normal muscle tone Psych: Awake and oriented X 3, normal affect, Insight and Judgment appropriate.     Dan Maker, NP 5:17 PM [facility]

## 2020-08-10 LAB — CBC WITH DIFFERENTIAL/PLATELET
Absolute Monocytes: 892 cells/uL (ref 200–950)
Basophils Absolute: 83 cells/uL (ref 0–200)
Basophils Relative: 0.9 %
Eosinophils Absolute: 598 cells/uL — ABNORMAL HIGH (ref 15–500)
Eosinophils Relative: 6.5 %
HCT: 41.5 % (ref 35.0–45.0)
Hemoglobin: 14.2 g/dL (ref 11.7–15.5)
Lymphs Abs: 3340 cells/uL (ref 850–3900)
MCH: 30.9 pg (ref 27.0–33.0)
MCHC: 34.2 g/dL (ref 32.0–36.0)
MCV: 90.4 fL (ref 80.0–100.0)
MPV: 10.9 fL (ref 7.5–12.5)
Monocytes Relative: 9.7 %
Neutro Abs: 4287 cells/uL (ref 1500–7800)
Neutrophils Relative %: 46.6 %
Platelets: 215 10*3/uL (ref 140–400)
RBC: 4.59 10*6/uL (ref 3.80–5.10)
RDW: 12.8 % (ref 11.0–15.0)
Total Lymphocyte: 36.3 %
WBC: 9.2 10*3/uL (ref 3.8–10.8)

## 2020-08-10 LAB — IRON,TIBC AND FERRITIN PANEL
%SAT: 20 % (calc) (ref 16–45)
Ferritin: 34 ng/mL (ref 16–232)
Iron: 75 ug/dL (ref 40–190)
TIBC: 384 mcg/dL (calc) (ref 250–450)

## 2020-08-10 LAB — VITAMIN B12: Vitamin B-12: 883 pg/mL (ref 200–1100)

## 2020-08-10 LAB — VITAMIN D 25 HYDROXY (VIT D DEFICIENCY, FRACTURES): Vit D, 25-Hydroxy: 40 ng/mL (ref 30–100)

## 2020-08-11 ENCOUNTER — Other Ambulatory Visit: Payer: Self-pay

## 2020-08-11 MED ORDER — ALPRAZOLAM 1 MG PO TABS: ORAL_TABLET | ORAL | 0 refills | Status: DC

## 2020-09-13 ENCOUNTER — Ambulatory Visit: Payer: BC Managed Care – PPO | Admitting: Adult Health

## 2020-09-27 ENCOUNTER — Ambulatory Visit: Payer: BC Managed Care – PPO | Admitting: Adult Health

## 2020-10-09 ENCOUNTER — Other Ambulatory Visit: Payer: Self-pay | Admitting: Adult Health

## 2020-10-11 NOTE — Progress Notes (Signed)
Assessment and Plan:   Tomorrow was seen today for follow-up.  Diagnoses and all orders for this visit:  GAD (generalized anxiety disorder) Depression, major, recurrent, in partial remission (HCC) Insomnia, unspecified type Improved, she would like to taper off of remeron Do 1/2 tab daily x 2 weeks then stop  Start trazodone 50 mg 1/2-2 tab PRN sleep Discussed risk of SS, will stick to lower doses Stress management techniques discussed, increase water, good sleep hygiene discussed, increase exercise, and increase veggies.   Osteopenia, unspecified location - get dexa q2y, discussed Vit D/K2 supplement and high calcium diet, weight bearing exercises - high risk, has fosamax but hasn't started  BMI 22 Improved with remeron, will taper off and monitor closely  Continue to recommend diet heavy in fruits and veggies and low in animal meats, cheeses, and dairy products, appropriate calorie intake Discuss exercise recommendations routinely Continue to monitor weight at each visit   Further disposition pending results of labs. Discussed med's effects and SE's.   Over 30 minutes of exam, counseling, chart review, and critical decision making was performed.   Future Appointments  Date Time Provider Department Center  11/08/2020  2:00 PM Revonda Humphrey, NP [facility] None   ------------------------------------------------------------------------------------------------------------------   HPI 45 y.o.female with GAD, major depression, insomnia, very low body weight, vitamin D def and osteoporosis. She is an adoptive mother of 70 month old twins, boy and girl.   She has GAD and MDD, has been on prozac, lexapro, celexa, effexor, trazodone in the past. Has required high doses for benefit. Most recently on sertraline 100 mg daily,  also added remeron due to severely low body weight and sleep. She notes significant improvement with this plan, mood was much improved but would to get back off of this  med. Did well with trazodone for sleep in the past, wants to restart.   BMI is Body mass index is 22.31 kg/m., she has gained 31 lb with addition of remeron, much improved mood and appetite.  Wt Readings from Last 3 Encounters:  10/12/20 132 lb (59.9 kg)  08/09/20 121 lb (54.9 kg)  05/04/20 101 lb (45.8 kg)   DEXA 04/14/2020 showed spine T -2.4. She wanted to proceed with alendronate but admits hasn't started.  Patient is on Vitamin D supplement, increased after last check  Lab Results  Component Value Date   VD25OH 40 08/09/2020      Past Medical History:  Diagnosis Date   BMI less than 19,adult 04/17/2020   Mild concussion 2022     No Known Allergies  Current Outpatient Medications on File Prior to Visit  Medication Sig   alendronate (FOSAMAX) 70 MG tablet Take 1 tablet (70 mg total) by mouth every 7 (seven) days. Take with a full glass of water on an empty stomach.   ALPRAZolam (XANAX) 1 MG tablet TAKE 1/2-1 TABLET BY MOUTH 2X/DAY AS NEEDED FOR ANXIETY. D0NT TAKE DAILY TO AVOID BUILDING TOLERANCE   baclofen (LIORESAL) 10 MG tablet Take 0.5-1 tablets (5-10 mg total) by mouth 3 (three) times daily as needed for muscle spasms.   cyanocobalamin 1000 MCG tablet Take 1,000 mcg by mouth daily.   ESTRADIOL PO Take by mouth daily.   ibuprofen (ADVIL) 800 MG tablet Take 1 tablet (800 mg total) by mouth every 8 (eight) hours as needed for cramping.   sertraline (ZOLOFT) 100 MG tablet TAKE 1 TABLET BY MOUTH EVERY DAY   VITAMIN D, CHOLECALCIFEROL, PO Take 5,000 Units by mouth.   estradiol-levonorgestrel (  CLIMARA PRO) 0.045-0.015 MG/DAY Climara Pro 0.045 mg-0.015 mg/24 hr transdermal patch  APPLY 1 PATCH BY TRANSDERMAL ROUTE EVERY WEEK (Patient not taking: No sig reported)   oxyCODONE-acetaminophen (PERCOCET/ROXICET) 5-325 MG tablet Take 1 tablet by mouth every 6 (six) hours as needed for severe pain. (Patient not taking: No sig reported)   Vitamins/Minerals TABS Take by mouth. (Patient not  taking: No sig reported)   Current Facility-Administered Medications on File Prior to Visit  Medication   ipratropium-albuterol (DUONEB) 0.5-2.5 (3) MG/3ML nebulizer solution 3 mL    ROS: all negative except above.   Physical Exam:  BP 118/76   Pulse 71   Temp (!) 97.5 F (36.4 C)   Wt 132 lb (59.9 kg)   SpO2 99%   BMI 22.31 kg/m   General Appearance: Well dressed, well groomed thin adult female, in no apparent distress. Eyes: PERRL, conjunctiva no swelling or erythema ENT/Mouth: Mask in place; Hearing normal.  Neck: Supple Respiratory: Respiratory effort normal Cardio: Appears well perfused Lymphatics: Non tender without lymphadenopathy.  Musculoskeletal: no obvious deformity, normal gait.  Skin: Warm, dry without rashes, lesions, ecchymosis.  Neuro: Normal muscle tone Psych: Awake and oriented X 3, normal affect, Insight and Judgment appropriate.     Dan Maker, NP 2:30 PM [facility]

## 2020-10-12 ENCOUNTER — Ambulatory Visit: Payer: BC Managed Care – PPO | Admitting: Adult Health

## 2020-10-12 ENCOUNTER — Other Ambulatory Visit: Payer: Self-pay

## 2020-10-12 ENCOUNTER — Encounter: Payer: Self-pay | Admitting: Adult Health

## 2020-10-12 VITALS — BP 118/76 | HR 71 | Temp 97.5°F | Wt 132.0 lb

## 2020-10-12 DIAGNOSIS — E559 Vitamin D deficiency, unspecified: Secondary | ICD-10-CM

## 2020-10-12 DIAGNOSIS — Z6820 Body mass index (BMI) 20.0-20.9, adult: Secondary | ICD-10-CM

## 2020-10-12 DIAGNOSIS — F17210 Nicotine dependence, cigarettes, uncomplicated: Secondary | ICD-10-CM

## 2020-10-12 DIAGNOSIS — M8580 Other specified disorders of bone density and structure, unspecified site: Secondary | ICD-10-CM

## 2020-10-12 DIAGNOSIS — J449 Chronic obstructive pulmonary disease, unspecified: Secondary | ICD-10-CM

## 2020-10-12 DIAGNOSIS — F3341 Major depressive disorder, recurrent, in partial remission: Secondary | ICD-10-CM

## 2020-10-12 DIAGNOSIS — G4700 Insomnia, unspecified: Secondary | ICD-10-CM | POA: Diagnosis not present

## 2020-10-12 DIAGNOSIS — F411 Generalized anxiety disorder: Secondary | ICD-10-CM

## 2020-10-12 MED ORDER — TRAZODONE HCL 50 MG PO TABS: ORAL_TABLET | ORAL | 1 refills | Status: DC

## 2020-10-18 ENCOUNTER — Telehealth: Payer: Self-pay

## 2020-10-18 NOTE — Telephone Encounter (Signed)
Patient would like to have another Spinal Epidural injection and a Rx?  Stated that she is having a flare up and was told that she could just call the office when she needed another injection.  CB# [phone].  Please advise.  Thank you.

## 2020-10-18 NOTE — Telephone Encounter (Signed)
Okay to get set up for another ESI but needs OV if she wants consideration for opioid prescription

## 2020-10-18 NOTE — Telephone Encounter (Signed)
I called and reached the patient's voice mail. Left message about needing ov for medication. Asked her to call me back in the morning, or I will try calling her, to see how she would like to proceed.

## 2020-10-18 NOTE — Telephone Encounter (Signed)
This patient was last seen by Dr. Prince Rome in April for a flareup in her lower back pain. He gave her Percocet and Baclofen, until she could get in to [facility] for an ESI. The meds and ESI relieved the pain until now. She is asking for refills on these medications and an order for another ESI. Can she get these without ov or does she need to come in for evaluation. I did advise her that she will need to at least come in in the future to establish ortho care with another provider here.

## 2020-10-19 ENCOUNTER — Other Ambulatory Visit: Payer: Self-pay

## 2020-10-19 ENCOUNTER — Encounter: Payer: Self-pay | Admitting: Physician Assistant

## 2020-10-19 ENCOUNTER — Ambulatory Visit (INDEPENDENT_AMBULATORY_CARE_PROVIDER_SITE_OTHER): Payer: BC Managed Care – PPO | Admitting: Physician Assistant

## 2020-10-19 DIAGNOSIS — M5442 Lumbago with sciatica, left side: Secondary | ICD-10-CM | POA: Diagnosis not present

## 2020-10-19 MED ORDER — CYCLOBENZAPRINE HCL 10 MG PO TABS: 10.0000 mg | ORAL_TABLET | Freq: Three times a day (TID) | ORAL | 0 refills | Status: DC | PRN

## 2020-10-19 MED ORDER — METHYLPREDNISOLONE 4 MG PO TABS
ORAL_TABLET | ORAL | 0 refills | Status: DC
Start: 2020-10-19 — End: 2021-05-17

## 2020-10-19 MED ORDER — OXYCODONE-ACETAMINOPHEN 5-325 MG PO TABS: 1.0000 | ORAL_TABLET | Freq: Four times a day (QID) | ORAL | 0 refills | Status: DC | PRN

## 2020-10-19 NOTE — Progress Notes (Signed)
Office Visit Note   Patient: Victoria Ashley           Date of Birth: 1975-03-23           MRN: [id number] Visit Date: 10/19/2020              Requested by: Lucky Cowboy, MD [address] [place],  [place] [address] PCP: Lucky Cowboy, MD   Assessment & Plan: Visit Diagnoses:  1. Left-sided low back pain with left-sided sciatica, unspecified chronicity     Plan: Recommend repeat MRI to rule out HNP as a source of her left radicular symptoms and weakness.  Follow-up after the MRI to go over results discuss further treatment.  She is placed on a prednisone Dosepak.  Discontinued her baclofen placed her on Flexeril.  Did refill her Percocet which she is she is to take sparingly.  Questions were encouraged and answered.  She did not develop any symptoms of cauda equina which are reviewed with her she is to go to the ER.  Follow-Up Instructions: Return After MRI.   Orders:  No orders of the defined types were placed in this encounter.  Meds ordered this encounter  Medications   methylPREDNISolone (MEDROL) 4 MG tablet    Sig: Take as directed    Dispense:  21 tablet    Refill:  0   cyclobenzaprine (FLEXERIL) 10 MG tablet    Sig: Take 1 tablet (10 mg total) by mouth 3 (three) times daily as needed for muscle spasms.    Dispense:  30 tablet    Refill:  0   oxyCODONE-acetaminophen (PERCOCET/ROXICET) 5-325 MG tablet    Sig: Take 1 tablet by mouth every 6 (six) hours as needed for severe pain.    Dispense:  20 tablet    Refill:  0      Procedures: No procedures performed   Clinical Data: No additional findings.   Subjective: Chief Complaint  Patient presents with   Lower Back - Pain    HPI Victoria Ashley 45 year old female has been seen by Dr. Prince Rome in the past.  She was last seen by Dr. Prince Rome on 05/24/2020 due to low back pain.  Done well until recently.  She did undergo Epidural steroid injection in [facility] radiology this was an inner laminar approach  left L2-L3.  She has had no new injury.  She has had epidural steroid injections over the years and feels that these.  Giving her less and less relief.  She is taking Percocet and baclofen for back pain.  She states she is out of the Percocet.  She states that Percocet is the only pain medicine and is giving her relief in the past.  Pain is waking her.  She denies any saddle anesthesia like symptoms.  Denies any bowel or bladder dysfunction fevers or chills.  Prior back pain involved shooting pain down the left leg to the knee.  She states her pain now is different.  It is now going into the left calf.  Prior MRI L-spine showed left foraminal disc protrusion at L2-3 which could potentially irritate the left L2 nerve root.  Mild bilateral facet hypertrophy at L2-3 through L5-S1.    Review of Systems  Constitutional:  Positive for chills. Negative for fever.  Musculoskeletal:  Positive for back pain.    Objective: Vital Signs: There were no vitals taken for this visit.  Physical Exam Constitutional:      Appearance: She is not ill-appearing or diaphoretic.  Cardiovascular:     Pulses: Normal pulses.  Pulmonary:     Effort: Pulmonary effort is normal.  Neurological:     Mental Status: She is alert and oriented to person, place, and time.  Psychiatric:        Mood and Affect: Mood normal.    Ortho Exam Positive straight leg raise bilaterally.  She has weakness (4/5)with dorsiflexion left ankle against resistance.  Extension of the left leg against resistance 4 out of 5 strength.  Otherwise 5-5 strength throughout lower extremities.  Sensation grossly intact bilateral feet to light touch dorsal pedal pulses are 2+ bilaterally. Specialty Comments:  No specialty comments available.  Imaging: No results found.   PMFS History: Patient Active Problem List   Diagnosis Date Noted   B12 deficiency 08/09/2020   BMI 20.0-20.9, adult 08/09/2020   Chronic left-sided low back pain with  left-sided sciatica 11/08/2019   Post-menopausal 11/11/2018   Vitamin D deficiency 10/02/2018   Migraine without status migrainosus, not intractable 10/02/2018   COPD (chronic obstructive pulmonary disease) (HCC) 10/31/2017   Osteopenia 03/21/2017   Depression, major, recurrent, in partial remission (HCC) 09/18/2016   RLS (restless legs syndrome) 09/18/2016   Insomnia 09/18/2016   Genetic testing 02/16/2016   GAD (generalized anxiety disorder) 12/15/2015   Cigarette nicotine dependence without complication 12/15/2015   History of colitis 12/15/2014   Past Medical History:  Diagnosis Date   BMI less than 19,adult 04/17/2020   Low body weight due to inadequate caloric intake    Mild concussion 2022    Family History  Problem Relation Age of Onset   Cancer Mother 28       breast cancer   Breast cancer Mother    Diabetes Father 29   Alcohol abuse Father    Cancer Maternal Grandmother 74       ovarian   Heart disease Maternal Grandfather     History reviewed. No pertinent surgical history. Social History   Occupational History   Not on file  Tobacco Use   Smoking status: Every Day    Packs/day: 0.25    Years: 20.00    Pack years: 5.00    Types: Cigarettes   Smokeless tobacco: Never  Vaping Use   Vaping Use: Never used  Substance and Sexual Activity   Alcohol use: No    Alcohol/week: 0.0 standard drinks   Drug use: No   Sexual activity: Yes    Birth control/protection: None    Comment: post menopausal

## 2020-10-19 NOTE — Telephone Encounter (Signed)
The patient called back. She would like to come in and be seen. Scheduled her with Rexene Edison, PA today at 1:00. Will wait to put in any order for the [facility], until Gilles Chiquito it, since he will be seeing her instead of Luke.

## 2020-10-20 ENCOUNTER — Ambulatory Visit
Admission: RE | Admit: 2020-10-20 | Discharge: 2020-10-20 | Disposition: A | Discharge status: Home / Self Care | Payer: BC Managed Care – PPO | Source: Ambulatory Visit | Attending: Physician Assistant | Admitting: Physician Assistant

## 2020-10-20 DIAGNOSIS — M5442 Lumbago with sciatica, left side: Secondary | ICD-10-CM

## 2020-10-20 DIAGNOSIS — M48061 Spinal stenosis, lumbar region without neurogenic claudication: Secondary | ICD-10-CM | POA: Diagnosis not present

## 2020-10-20 DIAGNOSIS — M545 Low back pain, unspecified: Secondary | ICD-10-CM | POA: Diagnosis not present

## 2020-10-23 ENCOUNTER — Other Ambulatory Visit: Payer: Self-pay

## 2020-10-23 ENCOUNTER — Ambulatory Visit: Payer: BC Managed Care – PPO | Admitting: Orthopaedic Surgery

## 2020-10-23 ENCOUNTER — Encounter: Payer: Self-pay | Admitting: Orthopaedic Surgery

## 2020-10-23 DIAGNOSIS — M5126 Other intervertebral disc displacement, lumbar region: Secondary | ICD-10-CM

## 2020-10-23 DIAGNOSIS — M5442 Lumbago with sciatica, left side: Secondary | ICD-10-CM

## 2020-10-23 MED ORDER — BACLOFEN 10 MG PO TABS: 10.0000 mg | ORAL_TABLET | Freq: Three times a day (TID) | ORAL | 1 refills | Status: DC

## 2020-10-23 NOTE — Progress Notes (Signed)
The patient is a petite 45 year old female who comes in to go over MRI of her lumbar spine.  She has had at least 2 or more epidural steroid injections in her lumbar spine at L to L3 to the left side.  Her symptoms have worsened to the left side with more radicular symptoms going down her left leg to the point that a new MRI was warranted.  Her last 2 epidural steroid injections were through [facility] imaging to the left-sided L2-L3.  She does appear very uncomfortable today.  She has significant difficulty with flexion and extension of her lumbar spine without pain.  She has a positive straight leg raise to the left side.  She is also having to deal with taking care of children at home.  She does state that she would like to go back on baclofen because Flexeril the Dr. Prince Rome had switch her to was not as helpful as baclofen.  She has subjective numbness going down her left leg and again is quite uncomfortable appearing.  I did go over the MRI findings with her showing her the report of the MRI and going over the actual films.  There is a significant contained disc to the left side at the L3 foramina and it does look like it is irritating the L3 nerve to the left side.  Given her level of discomfort I will send in some baclofen and stop her Flexeril.  I would also like to make referral to one of my colleagues with neurosurgery for evaluation treatment of the symptomatic disc to determine whether or not they feel surgery is indicated.  I will defer that decision and treatment to them.  She agrees with this treatment plan.  All questions and concerns were answered and addressed.

## 2020-11-02 ENCOUNTER — Other Ambulatory Visit: Payer: Self-pay

## 2020-11-02 ENCOUNTER — Other Ambulatory Visit: Payer: Self-pay | Admitting: Physician Assistant

## 2020-11-02 MED ORDER — OXYCODONE-ACETAMINOPHEN 5-325 MG PO TABS: 1.0000 | ORAL_TABLET | Freq: Four times a day (QID) | ORAL | 0 refills | Status: DC | PRN

## 2020-11-07 NOTE — Progress Notes (Deleted)
Complete Physical  Assessment and Plan:     Discussed med's effects and SE's. Screening labs and tests as requested with regular follow-up as recommended. Over 40 minutes of exam, counseling, chart review, and complex, high level critical decision making was performed this visit.   HPI  45 y.o. female  presents for a complete physical and follow up for has Depression, major, recurrent, in partial remission (HCC); RLS (restless legs syndrome); Insomnia; Osteopenia; COPD (chronic obstructive pulmonary disease) (HCC); Vitamin D deficiency; Migraine without status migrainosus, not intractable; Post-menopausal; History of colitis; GAD (generalized anxiety disorder); Cigarette nicotine dependence without complication; Genetic testing; Chronic left-sided low back pain with left-sided sciatica; B12 deficiency; and BMI 20.0-20.9, adult on their problem list..  Her blood pressure has been controlled at home, today their BP is   She does not workout, due to back pain right now. She denies chest pain, shortness of breath, dizziness.   She has a history of depression, anxiety, she has tried and failed lexapro, effexor, wellbutrin, paxil, prozac. She is currently on celexa and still feels that it is not helping with mood. She has a short fuse, will cry, very frustrated. She did get a DNA test to help determine the best medication for her. She has not tried some of the newer medications.   She is still smoking, she would like to quit but is very stressed. She tried very low dose chantix and could not tolerate it.   She does have vaginal dryness, has tried OTC and premarin that did not help. She has empty egg syndrome and early menopause, strong family history of breast cancer and grandmother with ovarian cancer.  MGM 01/2019- ? Breast MRI with history She had genetic counseling with [facility] that was negative, 01/2016.   She is on vitamin D and B12 supplement.  Lab Results  Component Value Date   VITAMINB12  883 08/09/2020   Lab Results  Component Value Date   VD25OH 40 08/09/2020   BMI is There is no height or weight on file to calculate BMI., she is not working out so she has had weight loss. Wt Readings from Last 3 Encounters:  10/12/20 132 lb (59.9 kg)  08/09/20 121 lb (54.9 kg)  05/04/20 101 lb (45.8 kg)     Current Medications:   Current Outpatient Medications (Endocrine & Metabolic):    alendronate (FOSAMAX) 70 MG tablet, Take 1 tablet (70 mg total) by mouth every 7 (seven) days. Take with a full glass of water on an empty stomach.   ESTRADIOL PO, Take by mouth daily.   estradiol-levonorgestrel (CLIMARA PRO) 0.045-0.015 MG/DAY, Climara Pro 0.045 mg-0.015 mg/24 hr transdermal patch  APPLY 1 PATCH BY TRANSDERMAL ROUTE EVERY WEEK (Patient not taking: No sig reported)   methylPREDNISolone (MEDROL) 4 MG tablet, Take as directed      Current Facility-Administered Medications (Respiratory):    ipratropium-albuterol (DUONEB) 0.5-2.5 (3) MG/3ML nebulizer solution 3 mL  Current Outpatient Medications (Analgesics):    ibuprofen (ADVIL) 800 MG tablet, Take 1 tablet (800 mg total) by mouth every 8 (eight) hours as needed for cramping.   oxyCODONE-acetaminophen (PERCOCET/ROXICET) 5-325 MG tablet, Take 1 tablet by mouth every 6 (six) hours as needed for severe pain.   Current Outpatient Medications (Hematological):    cyanocobalamin 1000 MCG tablet, Take 1,000 mcg by mouth daily.   Current Outpatient Medications (Other):    ALPRAZolam (XANAX) 1 MG tablet, TAKE 1/2-1 TABLET BY MOUTH 2X/DAY AS NEEDED FOR ANXIETY. D0NT TAKE DAILY TO AVOID  BUILDING TOLERANCE   baclofen (LIORESAL) 10 MG tablet, Take 1 tablet (10 mg total) by mouth 3 (three) times daily.   sertraline (ZOLOFT) 100 MG tablet, TAKE 1 TABLET BY MOUTH EVERY DAY   traZODone (DESYREL) 50 MG tablet, TAKE 1/2-2 TABLET FOR SLEEP, TAKE 1 HOUR PRIOR TO BEDTIME.   VITAMIN D, CHOLECALCIFEROL, PO, Take 5,000 Units by mouth.    Vitamins/Minerals TABS, Take by mouth. (Patient not taking: No sig reported)  Allergies:  No Known Allergies   Medical History:  She has Depression, major, recurrent, in partial remission (HCC); RLS (restless legs syndrome); Insomnia; Osteopenia; COPD (chronic obstructive pulmonary disease) (HCC); Vitamin D deficiency; Migraine without status migrainosus, not intractable; Post-menopausal; History of colitis; GAD (generalized anxiety disorder); Cigarette nicotine dependence without complication; Genetic testing; Chronic left-sided low back pain with left-sided sciatica; B12 deficiency; and BMI 20.0-20.9, adult on their problem list.   Health Maintenance:   Health Maintenance  Topic Date Due   Hepatitis C Screening  Never done   TETANUS/TDAP  Never done   PAP SMEAR-Modifier  09/04/2017   COVID-19 Vaccine (4 - Booster for Pfizer series) 03/05/2020   INFLUENZA VACCINE  09/04/2020   HIV Screening  Completed   HPV VACCINES  Aged Out    Tetanus: will get soon   Pneumovax: declines Prevnar 13: declines Flu vaccine: declines Zostavax: declines  No LMP recorded. Patient is premenopausal. Pap: 2018- Dr. Tenny Craw- neg HPV due 2023 MGM: 01/2019 DUE*** DEXA:04/2020 Lumbar spine T score -2.4 Osteoporosis Colonoscopy: 2017 EGD: N/A : Patient Care Team: Lucky Cowboy, MD as PCP - General (Internal Medicine)  Surgical History:  She has no past surgical history on file.   Family History:  Herfamily history includes Alcohol abuse in her father; Breast cancer in her mother; Cancer (age of onset: 51) in her mother; Cancer (age of onset: 32) in her maternal grandmother; Diabetes (age of onset: 61) in her father; Heart disease in her maternal grandfather.   Social History:  She reports that she has been smoking cigarettes. She has a 5.00 pack-year smoking history. She has never used smokeless tobacco. She reports that she does not drink alcohol and does not use drugs.  Review of Systems: Review  of Systems  Constitutional: Negative.  Negative for chills and fever.  HENT: Negative.  Negative for congestion, hearing loss, sinus pain, sore throat and tinnitus.   Eyes: Negative.  Negative for blurred vision and double vision.  Respiratory: Negative.  Negative for cough, hemoptysis, sputum production, shortness of breath and wheezing.   Cardiovascular: Negative.  Negative for chest pain, palpitations and leg swelling.  Gastrointestinal: Negative.  Negative for abdominal pain, constipation, diarrhea, heartburn, nausea and vomiting.  Genitourinary: Negative.  Negative for dysuria and urgency.  Musculoskeletal: Negative.  Negative for back pain, falls, joint pain, myalgias and neck pain.  Skin: Negative.  Negative for rash.  Neurological:  Negative for dizziness, tingling, tremors, weakness and headaches.  Endo/Heme/Allergies:  Does not bruise/bleed easily.  Psychiatric/Behavioral:  Negative for depression and suicidal ideas. The patient is not nervous/anxious and does not have insomnia.    Physical Exam: Estimated body mass index is 22.31 kg/m as calculated from the following:   Height as of 11/09/19: 5' 4.5" (1.638 m).   Weight as of 10/12/20: 132 lb (59.9 kg). There were no vitals taken for this visit. General Appearance: Well nourished, in no apparent distress.  Eyes: PERRLA, EOMs, conjunctiva no swelling or erythema, normal fundi and vessels.  Sinuses: No Frontal/maxillary tenderness  ENT/Mouth: Ext aud canals clear, normal light reflex with TMs without erythema, bulging. Good dentition. No erythema, swelling, or exudate on post pharynx. Tonsils not swollen or erythematous. Hearing normal.  Neck: Supple, thyroid normal. No bruits  Respiratory: Respiratory effort normal, BS equal bilaterally without rales, rhonchi, wheezing or stridor.  Cardio: RRR without murmurs, rubs or gallops. Brisk peripheral pulses without edema.  Chest: symmetric, with normal excursions and percussion.  Breasts:  Symmetric, without lumps, nipple discharge, retractions.  Abdomen: Soft, nontender, no guarding, rebound, hernias, masses, or organomegaly.  Lymphatics: Non tender without lymphadenopathy.  Genitourinary: defer Musculoskeletal: Full ROM all peripheral extremities. Skin: 2 mm dark nevus, good borders, unchanged on right flank, 1.5 mm new center of her back. Warm, dry without rashes, lesions, ecchymosis. Neuro: Cranial nerves intact, reflexes equal bilaterally. Normal muscle tone, no cerebellar symptoms. Sensation intact.  Psych: Awake and oriented X 3, normal affect, Insight and Judgment appropriate.    EKG: defer  Shia Delaine W Klein Willcox 1:11 PM [facility]

## 2020-11-08 ENCOUNTER — Encounter: Payer: Self-pay | Admitting: Adult Health

## 2020-11-08 ENCOUNTER — Encounter: Payer: BC Managed Care – PPO | Admitting: Adult Health Nurse Practitioner

## 2020-11-08 ENCOUNTER — Encounter: Payer: BC Managed Care – PPO | Admitting: Nurse Practitioner

## 2020-11-08 DIAGNOSIS — D649 Anemia, unspecified: Secondary | ICD-10-CM

## 2020-11-08 DIAGNOSIS — M810 Age-related osteoporosis without current pathological fracture: Secondary | ICD-10-CM

## 2020-11-08 DIAGNOSIS — Z1389 Encounter for screening for other disorder: Secondary | ICD-10-CM

## 2020-11-08 DIAGNOSIS — Z136 Encounter for screening for cardiovascular disorders: Secondary | ICD-10-CM

## 2020-11-08 DIAGNOSIS — Z13220 Encounter for screening for lipoid disorders: Secondary | ICD-10-CM

## 2020-11-08 DIAGNOSIS — F411 Generalized anxiety disorder: Secondary | ICD-10-CM

## 2020-11-08 DIAGNOSIS — Z0001 Encounter for general adult medical examination with abnormal findings: Secondary | ICD-10-CM

## 2020-11-08 DIAGNOSIS — F3341 Major depressive disorder, recurrent, in partial remission: Secondary | ICD-10-CM

## 2020-11-08 DIAGNOSIS — E559 Vitamin D deficiency, unspecified: Secondary | ICD-10-CM

## 2020-11-08 DIAGNOSIS — Z131 Encounter for screening for diabetes mellitus: Secondary | ICD-10-CM

## 2020-11-08 DIAGNOSIS — Z1329 Encounter for screening for other suspected endocrine disorder: Secondary | ICD-10-CM

## 2020-11-08 DIAGNOSIS — Z79899 Other long term (current) drug therapy: Secondary | ICD-10-CM

## 2020-11-08 DIAGNOSIS — G43909 Migraine, unspecified, not intractable, without status migrainosus: Secondary | ICD-10-CM

## 2020-11-08 DIAGNOSIS — G4700 Insomnia, unspecified: Secondary | ICD-10-CM

## 2020-11-08 DIAGNOSIS — J449 Chronic obstructive pulmonary disease, unspecified: Secondary | ICD-10-CM

## 2020-11-08 DIAGNOSIS — G2581 Restless legs syndrome: Secondary | ICD-10-CM

## 2020-11-09 ENCOUNTER — Other Ambulatory Visit: Payer: Self-pay | Admitting: Student

## 2020-11-09 DIAGNOSIS — M5416 Radiculopathy, lumbar region: Secondary | ICD-10-CM

## 2020-11-15 ENCOUNTER — Other Ambulatory Visit: Payer: Self-pay

## 2020-11-15 ENCOUNTER — Ambulatory Visit
Admission: RE | Admit: 2020-11-15 | Discharge: 2020-11-15 | Disposition: A | Discharge status: Home / Self Care | Payer: BC Managed Care – PPO | Source: Ambulatory Visit | Attending: Student | Admitting: Student

## 2020-11-15 DIAGNOSIS — M5416 Radiculopathy, lumbar region: Secondary | ICD-10-CM

## 2020-11-15 DIAGNOSIS — M47817 Spondylosis without myelopathy or radiculopathy, lumbosacral region: Secondary | ICD-10-CM | POA: Diagnosis not present

## 2020-11-15 DIAGNOSIS — M5126 Other intervertebral disc displacement, lumbar region: Secondary | ICD-10-CM | POA: Diagnosis not present

## 2020-11-15 MED ORDER — IOPAMIDOL (ISOVUE-M 200) INJECTION 41%
1.0000 mL | Freq: Once | INTRAMUSCULAR | Status: AC
  Administered 2020-11-15: 1 mL via EPIDURAL

## 2020-11-15 MED ORDER — METHYLPREDNISOLONE ACETATE 40 MG/ML INJ SUSP (RADIOLOG
80.0000 mg | Freq: Once | INTRAMUSCULAR | Status: AC
  Administered 2020-11-15: 80 mg via EPIDURAL

## 2020-11-15 NOTE — Discharge Instructions (Signed)

## 2020-11-30 DIAGNOSIS — M5416 Radiculopathy, lumbar region: Secondary | ICD-10-CM | POA: Diagnosis not present

## 2020-12-06 ENCOUNTER — Other Ambulatory Visit: Payer: Self-pay | Admitting: Internal Medicine

## 2020-12-11 DIAGNOSIS — E782 Mixed hyperlipidemia: Secondary | ICD-10-CM | POA: Insufficient documentation

## 2020-12-11 NOTE — Progress Notes (Signed)
Complete Physical  Assessment and Plan: Depression, major, recurrent, in partial remission (HCC) Continue Zoloft 100 mg QD   RLS (restless legs syndrome) Has tried Gabapentin in the past but is not having as many problems with RLS currently - get back to working out  Insomnia, unspecified type Can do XANAX PRN for sleep and trazodone  Migraine without status migrainosus, not intractable, unspecified migraine type  Currently well controlled  Vaginal atrophy -    Had Monalisa procedure and is doing well.  Osteopenia, unspecified location -     DEXA 04/2020 osteopenia Continue Vit D3 and K2  Screening cholesterol level -     Lipid panel  Vitamin D deficiency -     VITAMIN D 25 Hydroxy (Vit-D Deficiency, Fractures)  Medication management -     CBC with Differential/Platelet -     CMP -     Magnesium  Screening for hematuria or proteinuria -     Urinalysis, Routine w reflex microscopic -     Microalbumin / creatinine urine ratio  Encounter for general adult medical examination with abnormal findings Will order mammogram Refuses Flu due to upcoming surgery  Chronic obstructive pulmonary disease, unspecified COPD type ([place]) Advised to stop smoking - risks discussed. Declines medications.  CXR 05/04/20 Normal Currently stable without respiratory medications   Discussed med's effects and SE's. Screening labs and tests as requested with regular follow-up as recommended. Over 40 minutes of exam, counseling, chart review, and complex, high level critical decision making was performed this visit.   HPI  45 y.o. female  presents for a complete physical and follow up for has Depression, major, recurrent, in partial remission ([place]); RLS (restless legs syndrome); Insomnia; Osteopenia; COPD (chronic obstructive pulmonary disease) ([place]); Vitamin D deficiency; Migraine without status migrainosus, not intractable; Post-menopausal; History of colitis; GAD (generalized anxiety disorder);  Cigarette nicotine dependence without complication; Genetic testing; Chronic left-sided low back pain with left-sided sciatica; B12 deficiency; BMI 20.0-20.9, adult; and Mixed hyperlipidemia on their problem list..  Her blood pressure has been controlled at home, today their BP is BP: 108/74 She does not workout, due to back pain right now. She denies chest pain, shortness of breath, dizziness.   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 Does not need fusion, may have partial discectomy.  She does have aching pain in lumbar area with radiation of pain down both legs.  She has a history of depression, anxiety, she has tried and failed lexapro, effexor, wellbutrin, paxil, prozac. She is currently on zoloft and is working well.  She is still smoking, she is only smoking 3-4 cigarettes a day.   She did have Monalisa procedure and pelvic floor training. Doing well currently.  She has empty egg syndrome and early menopause, strong family history of breast cancer and grandmother with ovarian cancer.  MGM 2021 Mammogram with mobile mammography She had genetic counseling with [facility] that was negative, 01/2016.   She is on vitamin D and B12 supplement.  Lab Results  Component Value Date   [id number] 08/09/2020   Lab Results  Component Value Date   VD25OH 40 08/09/2020   BMI is Body mass index is 22.31 kg/m., she is not working out so she has had weight loss. Wt Readings from Last 3 Encounters:  12/14/20 130 lb (59 kg)  10/12/20 132 lb (59.9 kg)  08/09/20 121 lb (54.9 kg)     Current Medications:   Current Outpatient Medications (  Endocrine & Metabolic):    ESTRADIOL PO, Take by mouth daily.   estradiol-levonorgestrel (CLIMARA PRO) 0.045-0.015 MG/DAY, Climara Pro 0.045 mg-0.015 mg/24 hr transdermal patch  APPLY 1 PATCH BY TRANSDERMAL ROUTE EVERY WEEK   methylPREDNISolone (MEDROL) 4 MG tablet, Take as directed   alendronate (FOSAMAX) 70 MG tablet,  Take 1 tablet (70 mg total) by mouth every 7 (seven) days. Take with a full glass of water on an empty stomach. (Patient not taking: Reported on 12/14/2020)      Current Facility-Administered Medications (Respiratory):    ipratropium-albuterol (DUONEB) 0.5-2.5 (3) MG/3ML nebulizer solution 3 mL  Current Outpatient Medications (Analgesics):    ibuprofen (ADVIL) 800 MG tablet, Take 1 tablet (800 mg total) by mouth every 8 (eight) hours as needed for cramping.   oxyCODONE-acetaminophen (PERCOCET/ROXICET) 5-325 MG tablet, Take 1 tablet by mouth every 6 (six) hours as needed for severe pain.   Current Outpatient Medications (Hematological):    cyanocobalamin 1000 MCG tablet, Take 1,000 mcg by mouth daily.   Current Outpatient Medications (Other):    ALPRAZolam (XANAX) 1 MG tablet, TAKE 1/2-1 TABLET BY MOUTH 2X/DAY AS NEEDED FOR ANXIETY. D0NT TAKE DAILY TO AVOID BUILDING TOLERANCE   baclofen (LIORESAL) 10 MG tablet, Take 1 tablet (10 mg total) by mouth 3 (three) times daily.   sertraline (ZOLOFT) 100 MG tablet, TAKE 1 TABLET BY MOUTH EVERY DAY   traZODone (DESYREL) 50 MG tablet, TAKE 1/2-2 TABLET FOR SLEEP, TAKE 1 HOUR PRIOR TO BEDTIME.   VITAMIN D, CHOLECALCIFEROL, PO, Take 5,000 Units by mouth.   Vitamins/Minerals TABS, Take by mouth. (Patient not taking: Reported on 12/14/2020)  Allergies:  No Known Allergies   Medical History:  She has Depression, major, recurrent, in partial remission ([place]); RLS (restless legs syndrome); Insomnia; Osteopenia; COPD (chronic obstructive pulmonary disease) ([place]); Vitamin D deficiency; Migraine without status migrainosus, not intractable; Post-menopausal; History of colitis; GAD (generalized anxiety disorder); Cigarette nicotine dependence without complication; Genetic testing; Chronic left-sided low back pain with left-sided sciatica; B12 deficiency; BMI 20.0-20.9, adult; and Mixed hyperlipidemia on their problem list.   Health Maintenance:   Health  Maintenance  Topic Date Due   Pneumococcal Vaccine 57-24 Years old (1 - PCV) Never done   Hepatitis C Screening  Never done   TETANUS/TDAP  Never done   PAP SMEAR-Modifier  09/04/2017   COVID-19 Vaccine (4 - Booster for Pfizer series) 02/06/2020   INFLUENZA VACCINE  09/04/2020   HIV Screening  Completed   HPV VACCINES  Aged Out    Tetanus: will get soon   Pneumovax: declines Prevnar 13: declines Flu vaccine: declines Zostavax: declines  No LMP recorded. Patient is premenopausal. Pap: 2018- Dr. Harrington Challenger- neg HPV due 2023 MGM: 2021, due now DEXA: 04/2020 osteopenia Colonoscopy: 2017 EGD: N/A : Patient Care Team: Unk Pinto, MD as PCP - General (Internal Medicine)  Surgical History:  She has no past surgical history on file.   Family History:  Herfamily history includes Alcohol abuse in her father; Breast cancer in her mother; Cancer (age of onset: 39) in her mother; Cancer (age of onset: 32) in her maternal grandmother; Diabetes (age of onset: 75) in her father; Heart disease in her maternal grandfather.   Social History:  She reports that she has been smoking cigarettes. She has a 5.00 pack-year smoking history. She has never used smokeless tobacco. She reports that she does not drink alcohol and does not use drugs.  Review of Systems: Review of Systems  Constitutional: Negative.  Negative  for chills and fever.  HENT: Negative.  Negative for congestion, hearing loss, sinus pain, sore throat and tinnitus.   Eyes: Negative.  Negative for blurred vision and double vision.  Respiratory: Negative.  Negative for cough, hemoptysis, sputum production, shortness of breath and wheezing.   Cardiovascular: Negative.  Negative for chest pain, palpitations and leg swelling.  Gastrointestinal: Negative.  Negative for abdominal pain, constipation, diarrhea, heartburn, nausea and vomiting.  Genitourinary: Negative.  Negative for dysuria and urgency.  Musculoskeletal:  Positive for back  pain. Negative for falls, joint pain, myalgias and neck pain.  Skin: Negative.  Negative for rash.  Neurological:  Negative for dizziness, tingling, tremors, weakness and headaches.  Endo/Heme/Allergies:  Does not bruise/bleed easily.  Psychiatric/Behavioral:  Negative for depression and suicidal ideas. The patient has insomnia. The patient is not nervous/anxious.    Physical Exam: Estimated body mass index is 22.31 kg/m as calculated from the following:   Height as of this encounter: 5\' 4"  (1.626 m).   Weight as of this encounter: 130 lb (59 kg). BP 108/74   Pulse 90   Temp (!) 97.5 F (36.4 C)   Ht 5\' 4"  (1.626 m)   Wt 130 lb (59 kg)   SpO2 99%   BMI 22.31 kg/m  General Appearance: Well nourished, in no apparent distress.  Eyes: PERRLA, EOMs, conjunctiva no swelling or erythema, normal fundi and vessels.  Sinuses: No Frontal/maxillary tenderness  ENT/Mouth: Ext aud canals clear, normal light reflex with TMs without erythema, bulging. Good dentition. No erythema, swelling, or exudate on post pharynx. Tonsils not swollen or erythematous. Hearing normal.  Neck: Supple, thyroid normal. No bruits  Respiratory: Respiratory effort normal, BS equal bilaterally without rales, rhonchi, wheezing or stridor.  Cardio: RRR without murmurs, rubs or gallops. Brisk peripheral pulses without edema.  Chest: symmetric, with normal excursions and percussion.  Breasts: Defer  Abdomen: Soft, nontender, no guarding, rebound, hernias, masses, or organomegaly.  Lymphatics: Non tender without lymphadenopathy.  Genitourinary: defer Musculoskeletal: Full ROM all peripheral extremities. Skin: 2 mm dark nevus, good borders, unchanged on right flank, 1.5 mm new center of her back. Warm, dry without rashes, lesions, ecchymosis. Neuro: Cranial nerves intact, reflexes equal bilaterally. Normal muscle tone, no cerebellar symptoms. Sensation intact.  Psych: Awake and oriented X 3, normal affect, Insight and Judgment  appropriate.    EKG: NSR  Koua Deeg W Delfin Squillace 2:17 PM [facility]

## 2020-12-14 ENCOUNTER — Encounter: Payer: Self-pay | Admitting: Nurse Practitioner

## 2020-12-14 ENCOUNTER — Ambulatory Visit: Payer: BC Managed Care – PPO | Admitting: Nurse Practitioner

## 2020-12-14 ENCOUNTER — Other Ambulatory Visit: Payer: Self-pay

## 2020-12-14 VITALS — BP 108/74 | HR 90 | Temp 97.5°F | Ht 64.0 in | Wt 130.0 lb

## 2020-12-14 DIAGNOSIS — G43909 Migraine, unspecified, not intractable, without status migrainosus: Secondary | ICD-10-CM

## 2020-12-14 DIAGNOSIS — Z1231 Encounter for screening mammogram for malignant neoplasm of breast: Secondary | ICD-10-CM

## 2020-12-14 DIAGNOSIS — Z1389 Encounter for screening for other disorder: Secondary | ICD-10-CM

## 2020-12-14 DIAGNOSIS — Z131 Encounter for screening for diabetes mellitus: Secondary | ICD-10-CM

## 2020-12-14 DIAGNOSIS — Z0001 Encounter for general adult medical examination with abnormal findings: Secondary | ICD-10-CM

## 2020-12-14 DIAGNOSIS — R7309 Other abnormal glucose: Secondary | ICD-10-CM

## 2020-12-14 DIAGNOSIS — Z136 Encounter for screening for cardiovascular disorders: Secondary | ICD-10-CM | POA: Diagnosis not present

## 2020-12-14 DIAGNOSIS — G4700 Insomnia, unspecified: Secondary | ICD-10-CM

## 2020-12-14 DIAGNOSIS — Z13220 Encounter for screening for lipoid disorders: Secondary | ICD-10-CM | POA: Diagnosis not present

## 2020-12-14 DIAGNOSIS — Z0000 Encounter for general adult medical examination without abnormal findings: Secondary | ICD-10-CM | POA: Diagnosis not present

## 2020-12-14 DIAGNOSIS — N952 Postmenopausal atrophic vaginitis: Secondary | ICD-10-CM

## 2020-12-14 DIAGNOSIS — F3341 Major depressive disorder, recurrent, in partial remission: Secondary | ICD-10-CM

## 2020-12-14 DIAGNOSIS — I10 Essential (primary) hypertension: Secondary | ICD-10-CM | POA: Diagnosis not present

## 2020-12-14 DIAGNOSIS — Z79899 Other long term (current) drug therapy: Secondary | ICD-10-CM | POA: Diagnosis not present

## 2020-12-14 DIAGNOSIS — J449 Chronic obstructive pulmonary disease, unspecified: Secondary | ICD-10-CM

## 2020-12-14 DIAGNOSIS — M8580 Other specified disorders of bone density and structure, unspecified site: Secondary | ICD-10-CM

## 2020-12-14 DIAGNOSIS — E559 Vitamin D deficiency, unspecified: Secondary | ICD-10-CM | POA: Diagnosis not present

## 2020-12-14 DIAGNOSIS — G2581 Restless legs syndrome: Secondary | ICD-10-CM

## 2020-12-14 DIAGNOSIS — E782 Mixed hyperlipidemia: Secondary | ICD-10-CM

## 2020-12-14 NOTE — Patient Instructions (Signed)

## 2020-12-15 LAB — CBC WITH DIFFERENTIAL/PLATELET
Absolute Monocytes: 706 cells/uL (ref 200–950)
Basophils Absolute: 42 cells/uL (ref 0–200)
Basophils Relative: 0.5 %
Eosinophils Absolute: 302 cells/uL (ref 15–500)
Eosinophils Relative: 3.6 %
HCT: 39.3 % (ref 35.0–45.0)
Hemoglobin: 13.4 g/dL (ref 11.7–15.5)
Lymphs Abs: 2663 cells/uL (ref 850–3900)
MCH: 30.9 pg (ref 27.0–33.0)
MCHC: 34.1 g/dL (ref 32.0–36.0)
MCV: 90.6 fL (ref 80.0–100.0)
MPV: 10.2 fL (ref 7.5–12.5)
Monocytes Relative: 8.4 %
Neutro Abs: 4687 cells/uL (ref 1500–7800)
Neutrophils Relative %: 55.8 %
Platelets: 220 10*3/uL (ref 140–400)
RBC: 4.34 10*6/uL (ref 3.80–5.10)
RDW: 12.7 % (ref 11.0–15.0)
Total Lymphocyte: 31.7 %
WBC: 8.4 10*3/uL (ref 3.8–10.8)

## 2020-12-15 LAB — LIPID PANEL
Cholesterol: 201 mg/dL — ABNORMAL HIGH (ref <200)
HDL: 66 mg/dL (ref >=50)
LDL Cholesterol (Calc): 116 mg/dL (calc) — ABNORMAL HIGH
Non-HDL Cholesterol (Calc): 135 mg/dL (calc) — ABNORMAL HIGH (ref <130)
Total CHOL/HDL Ratio: 3 (calc) (ref <5.0)
Triglycerides: 86 mg/dL (ref <150)

## 2020-12-15 LAB — URINALYSIS, ROUTINE W REFLEX MICROSCOPIC
Bilirubin Urine: NEGATIVE
Glucose, UA: NEGATIVE
Hgb urine dipstick: NEGATIVE
Ketones, ur: NEGATIVE
Leukocytes,Ua: NEGATIVE
Nitrite: NEGATIVE
Protein, ur: NEGATIVE
Specific Gravity, Urine: 1.028 (ref 1.001–1.035)
pH: 6.5 (ref 5.0–8.0)

## 2020-12-15 LAB — MICROALBUMIN / CREATININE URINE RATIO
Creatinine, Urine: 195 mg/dL (ref 20–275)
Microalb Creat Ratio: 2 mcg/mg creat (ref <30)
Microalb, Ur: 0.4 mg/dL

## 2020-12-15 LAB — HEMOGLOBIN A1C
Hgb A1c MFr Bld: 5.5 % of total Hgb (ref <5.7)
Mean Plasma Glucose: 111 mg/dL
eAG (mmol/L): 6.2 mmol/L

## 2020-12-15 LAB — TSH: TSH: 1.16 mIU/L

## 2020-12-15 LAB — MAGNESIUM: Magnesium: 2.2 mg/dL (ref 1.5–2.5)

## 2020-12-15 LAB — COMPLETE METABOLIC PANEL WITH GFR
AG Ratio: 1.9 (calc) (ref 1.0–2.5)
ALT: 17 U/L (ref 6–29)
AST: 18 U/L (ref 10–30)
Albumin: 4.5 g/dL (ref 3.6–5.1)
Alkaline phosphatase (APISO): 80 U/L (ref 31–125)
BUN: 14 mg/dL (ref 7–25)
CO2: 27 mmol/L (ref 20–32)
Calcium: 9.3 mg/dL (ref 8.6–10.2)
Chloride: 107 mmol/L (ref 98–110)
Creat: 0.84 mg/dL (ref 0.50–0.99)
Globulin: 2.4 g/dL (calc) (ref 1.9–3.7)
Glucose, Bld: 90 mg/dL (ref 65–99)
Potassium: 4.4 mmol/L (ref 3.5–5.3)
Sodium: 143 mmol/L (ref 135–146)
Total Bilirubin: 0.2 mg/dL (ref 0.2–1.2)
Total Protein: 6.9 g/dL (ref 6.1–8.1)
eGFR: 88 mL/min/{1.73_m2} (ref >=60)

## 2020-12-15 LAB — VITAMIN D 25 HYDROXY (VIT D DEFICIENCY, FRACTURES): Vit D, 25-Hydroxy: 39 ng/mL (ref 30–100)

## 2021-01-01 ENCOUNTER — Other Ambulatory Visit: Payer: Self-pay | Admitting: Adult Health

## 2021-01-01 MED ORDER — ALPRAZOLAM 1 MG PO TABS: ORAL_TABLET | ORAL | 0 refills | Status: DC

## 2021-01-03 DIAGNOSIS — M5126 Other intervertebral disc displacement, lumbar region: Secondary | ICD-10-CM | POA: Diagnosis not present

## 2021-01-03 DIAGNOSIS — M5416 Radiculopathy, lumbar region: Secondary | ICD-10-CM | POA: Diagnosis not present

## 2021-01-03 DIAGNOSIS — M48061 Spinal stenosis, lumbar region without neurogenic claudication: Secondary | ICD-10-CM | POA: Diagnosis not present

## 2021-01-03 DIAGNOSIS — M5116 Intervertebral disc disorders with radiculopathy, lumbar region: Secondary | ICD-10-CM | POA: Diagnosis not present

## 2021-01-03 HISTORY — PX: POSTERIOR LAMINECTOMY / DECOMPRESSION LUMBAR SPINE: SUR740

## 2021-01-04 ENCOUNTER — Encounter: Payer: Self-pay | Admitting: Internal Medicine

## 2021-01-17 ENCOUNTER — Encounter: Payer: Self-pay | Admitting: Internal Medicine

## 2021-02-26 DIAGNOSIS — M5126 Other intervertebral disc displacement, lumbar region: Secondary | ICD-10-CM | POA: Diagnosis not present

## 2021-03-01 DIAGNOSIS — M5126 Other intervertebral disc displacement, lumbar region: Secondary | ICD-10-CM | POA: Diagnosis not present

## 2021-03-05 DIAGNOSIS — M5126 Other intervertebral disc displacement, lumbar region: Secondary | ICD-10-CM | POA: Diagnosis not present

## 2021-03-15 DIAGNOSIS — M5126 Other intervertebral disc displacement, lumbar region: Secondary | ICD-10-CM | POA: Diagnosis not present

## 2021-03-19 DIAGNOSIS — M5126 Other intervertebral disc displacement, lumbar region: Secondary | ICD-10-CM | POA: Diagnosis not present

## 2021-03-22 DIAGNOSIS — M5126 Other intervertebral disc displacement, lumbar region: Secondary | ICD-10-CM | POA: Diagnosis not present

## 2021-03-26 DIAGNOSIS — M5126 Other intervertebral disc displacement, lumbar region: Secondary | ICD-10-CM | POA: Diagnosis not present

## 2021-04-05 ENCOUNTER — Other Ambulatory Visit: Payer: Self-pay | Admitting: Adult Health

## 2021-04-05 DIAGNOSIS — G4700 Insomnia, unspecified: Secondary | ICD-10-CM

## 2021-04-19 DIAGNOSIS — Z124 Encounter for screening for malignant neoplasm of cervix: Secondary | ICD-10-CM | POA: Diagnosis not present

## 2021-04-19 DIAGNOSIS — Z1231 Encounter for screening mammogram for malignant neoplasm of breast: Secondary | ICD-10-CM | POA: Diagnosis not present

## 2021-04-19 DIAGNOSIS — Z6823 Body mass index (BMI) 23.0-23.9, adult: Secondary | ICD-10-CM | POA: Diagnosis not present

## 2021-04-19 DIAGNOSIS — Z01419 Encounter for gynecological examination (general) (routine) without abnormal findings: Secondary | ICD-10-CM | POA: Diagnosis not present

## 2021-04-19 DIAGNOSIS — Z1151 Encounter for screening for human papillomavirus (HPV): Secondary | ICD-10-CM | POA: Diagnosis not present

## 2021-05-17 ENCOUNTER — Ambulatory Visit: Payer: BC Managed Care – PPO | Admitting: Adult Health

## 2021-05-17 ENCOUNTER — Encounter: Payer: Self-pay | Admitting: Adult Health

## 2021-05-17 VITALS — BP 102/66 | HR 65 | Temp 97.6°F | Wt 133.0 lb

## 2021-05-17 DIAGNOSIS — J449 Chronic obstructive pulmonary disease, unspecified: Secondary | ICD-10-CM

## 2021-05-17 DIAGNOSIS — F3341 Major depressive disorder, recurrent, in partial remission: Secondary | ICD-10-CM

## 2021-05-17 DIAGNOSIS — G4700 Insomnia, unspecified: Secondary | ICD-10-CM | POA: Diagnosis not present

## 2021-05-17 DIAGNOSIS — F411 Generalized anxiety disorder: Secondary | ICD-10-CM

## 2021-05-17 DIAGNOSIS — E559 Vitamin D deficiency, unspecified: Secondary | ICD-10-CM | POA: Diagnosis not present

## 2021-05-17 DIAGNOSIS — E782 Mixed hyperlipidemia: Secondary | ICD-10-CM

## 2021-05-17 DIAGNOSIS — F17210 Nicotine dependence, cigarettes, uncomplicated: Secondary | ICD-10-CM

## 2021-05-17 DIAGNOSIS — R5383 Other fatigue: Secondary | ICD-10-CM

## 2021-05-17 DIAGNOSIS — Z6822 Body mass index (BMI) 22.0-22.9, adult: Secondary | ICD-10-CM

## 2021-05-17 MED ORDER — TRAZODONE HCL 50 MG PO TABS: ORAL_TABLET | ORAL | 1 refills | Status: DC

## 2021-05-17 MED ORDER — QUETIAPINE FUMARATE 25 MG PO TABS: 12.5000 mg | ORAL_TABLET | Freq: Every day | ORAL | 0 refills | Status: DC

## 2021-05-17 NOTE — Progress Notes (Signed)
Assessment and Plan: ? ? ?Victoria Ashley was seen today for follow-up. ? ?Diagnoses and all orders for this visit: ? ?GAD (generalized anxiety disorder) ?Depression, major, recurrent, in partial remission (HCC) ?Insomnia, unspecified type ?Having more difficulty with sleep, fatigue, down mood ?Will try trazodone up to 150 mg ?If not improving try seroquel 12.5-25 mg at bedtime ?Close follow up in 6 weeks ?If dramatic mood improvement with seroquel but excess weight gain consider switch to vraylar ?Stress management techniques discussed, increase water, good sleep hygiene discussed, increase exercise, and increase veggies.  ?-     traZODone (DESYREL) 50 MG tablet; Take 1-3 tabs 1 hour prior to bedtime as needed for sleep. ?-     QUEtiapine (SEROQUEL) 25 MG tablet; Take 0.5-1 tablets (12.5-25 mg total) by mouth at bedtime. ? ?BMI 22 ?Stable off of remeron  ?Continue to recommend diet heavy in fruits and veggies and low in animal meats, cheeses, and dairy products, appropriate calorie intake ?Discuss exercise recommendations routinely ?Continue to monitor weight at each visit ? ?Cigarette nicotine dependence without complication ?Smoking cessation encouraged;  ? ?Chronic obstructive pulmonary disease, unspecified COPD type (HCC) ?Denies sx; inhaler PRN when ill, smoking cessation encouraged ? ?Mixed hyperlipidemia ?-     Lipid panel ? ?Vitamin D deficiency ?-     VITAMIN D 25 Hydroxy (Vit-D Deficiency, Fractures) ? ?Fatigue, unspecified type ?-     CBC with Differential/Platelet ?-     COMPLETE METABOLIC PANEL WITH GFR ?-     VITAMIN D 25 Hydroxy (Vit-D Deficiency, Fractures) ? ? ?Further disposition pending results of labs. Discussed med's effects and SE's.   ?Over 30 minutes of exam, counseling, chart review, and critical decision making was performed.  ? ?Future Appointments  ?Date Time Provider Department Center  ?06/28/2021 11:00 AM Judd Gaudier, NP [facility] None  ?12/17/2021  2:00 PM Mull, Loma Sousa, NP [facility] None   ? ?------------------------------------------------------------------------------------------------------------------ ? ? ?HPI ?46 y.o.female with GAD, major depression, insomnia, very low body weight, vitamin D def and osteoporosis.  ? ?She has L3-4 Lumbar decompression in 12/2020 and reports doing much better, denies residual concerns.  ? ?She is an adoptive mother of toddler twins, boy and girl.  ? ?Presents today for med management, notes increased fatigue and down mood, poor sleep in last few months.  ? ?She has GAD and MDD, has been on prozac, lexapro, celexa, effexor, trazodone in the past. Has required high doses for benefit. Most recently on sertraline 100 mg daily,  also was remeron due to severely low body weight and sleep with improvement but per her preference has tapered off and has been back on trazodone, taking 75 mg with modest initial benefit, hasn't tried higher doses.  ? ?She continues to smoke lightly, 2-3 cigarettes in the evening, COPD by imaging, requires inhalers only when ill. ? ?BMI is Body mass index is 22.83 kg/m?., she gained 31 lb with addition of remeron, much improved mood and appetite but tapered off. Has maintained weight but admits irregular with making good food choices.  ?Wt Readings from Last 3 Encounters:  ?05/17/21 133 lb (60.3 kg)  ?12/14/20 130 lb (59 kg)  ?10/12/20 132 lb (59.9 kg)  ? ?Patient is on Vitamin D supplement,  ?Lab Results  ?Component Value Date  ? VD25OH 39 12/14/2020  ?   ?On supplement, hasn't changed dose ?Lab Results  ?Component Value Date  ? FTDDUKGU54 883 08/09/2020  ? ? ?Lab Results  ?Component Value Date  ? CHOL 201 (H) 12/14/2020  ?  HDL 66 12/14/2020  ? LDLCALC 116 (H) 12/14/2020  ? TRIG 86 12/14/2020  ? CHOLHDL 3.0 12/14/2020  ? ? ? ? ?Past Medical History:  ?Diagnosis Date  ? BMI less than 19,adult 04/17/2020  ? Low body weight due to inadequate caloric intake   ? Mild concussion 2022  ?  ? ?No Known Allergies ? ?Current Outpatient Medications on  File Prior to Visit  ?Medication Sig  ? baclofen (LIORESAL) 10 MG tablet Take 1 tablet (10 mg total) by mouth 3 (three) times daily.  ? ESTRADIOL PO Take by mouth daily.  ? sertraline (ZOLOFT) 100 MG tablet TAKE 1 TABLET BY MOUTH EVERY DAY  ? VITAMIN D, CHOLECALCIFEROL, PO Take 5,000 Units by mouth. (Patient not taking: Reported on 05/17/2021)  ? ?Current Facility-Administered Medications on File Prior to Visit  ?Medication  ? ipratropium-albuterol (DUONEB) 0.5-2.5 (3) MG/3ML nebulizer solution 3 mL  ? ? ?ROS: all negative except above.  ? ?Physical Exam: ? ?BP 102/66   Pulse 65   Temp 97.6 ?F (36.4 ?C)   Wt 133 lb (60.3 kg)   SpO2 99%   BMI 22.83 kg/m?  ? ?General Appearance: Well dressed, well groomed thin adult female, in no apparent distress. ?Eyes: PERRL, conjunctiva no swelling or erythema ?ENT/Mouth: Mask in place; Hearing normal.  ?Neck: Supple ?Respiratory: Respiratory effort normal ?Cardio: Appears well perfused ?Lymphatics: Non tender without lymphadenopathy.  ?Musculoskeletal: no obvious deformity, normal gait.  ?Skin: Warm, dry without rashes, lesions, ecchymosis.  ?Neuro: Normal muscle tone ?Psych: Awake and oriented X 3, normal affect, Insight and Judgment appropriate.  ?  ? ?Dan Maker, NP ?2:38 PM ?[facility] ? ?

## 2021-05-18 LAB — CBC WITH DIFFERENTIAL/PLATELET
Absolute Monocytes: 638 cells/uL (ref 200–950)
Basophils Absolute: 38 cells/uL (ref 0–200)
Basophils Relative: 0.5 %
Eosinophils Absolute: 293 cells/uL (ref 15–500)
Eosinophils Relative: 3.9 %
HCT: 42.9 % (ref 35.0–45.0)
Hemoglobin: 14.1 g/dL (ref 11.7–15.5)
Lymphs Abs: 2333 cells/uL (ref 850–3900)
MCH: 29.4 pg (ref 27.0–33.0)
MCHC: 32.9 g/dL (ref 32.0–36.0)
MCV: 89.6 fL (ref 80.0–100.0)
MPV: 10.4 fL (ref 7.5–12.5)
Monocytes Relative: 8.5 %
Neutro Abs: 4200 cells/uL (ref 1500–7800)
Neutrophils Relative %: 56 %
Platelets: 224 10*3/uL (ref 140–400)
RBC: 4.79 10*6/uL (ref 3.80–5.10)
RDW: 12.3 % (ref 11.0–15.0)
Total Lymphocyte: 31.1 %
WBC: 7.5 10*3/uL (ref 3.8–10.8)

## 2021-05-18 LAB — COMPLETE METABOLIC PANEL WITH GFR
AG Ratio: 2 (calc) (ref 1.0–2.5)
ALT: 9 U/L (ref 6–29)
AST: 13 U/L (ref 10–35)
Albumin: 4.7 g/dL (ref 3.6–5.1)
Alkaline phosphatase (APISO): 74 U/L (ref 31–125)
BUN: 10 mg/dL (ref 7–25)
CO2: 28 mmol/L (ref 20–32)
Calcium: 9.7 mg/dL (ref 8.6–10.2)
Chloride: 105 mmol/L (ref 98–110)
Creat: 0.84 mg/dL (ref 0.50–0.99)
Globulin: 2.4 g/dL (calc) (ref 1.9–3.7)
Glucose, Bld: 72 mg/dL (ref 65–99)
Potassium: 4.5 mmol/L (ref 3.5–5.3)
Sodium: 141 mmol/L (ref 135–146)
Total Bilirubin: 0.4 mg/dL (ref 0.2–1.2)
Total Protein: 7.1 g/dL (ref 6.1–8.1)
eGFR: 87 mL/min/{1.73_m2} (ref >=60)

## 2021-05-18 LAB — LIPID PANEL
Cholesterol: 186 mg/dL (ref <200)
HDL: 63 mg/dL (ref >=50)
LDL Cholesterol (Calc): 108 mg/dL (calc) — ABNORMAL HIGH
Non-HDL Cholesterol (Calc): 123 mg/dL (calc) (ref <130)
Total CHOL/HDL Ratio: 3 (calc) (ref <5.0)
Triglycerides: 68 mg/dL (ref <150)

## 2021-05-18 LAB — VITAMIN D 25 HYDROXY (VIT D DEFICIENCY, FRACTURES): Vit D, 25-Hydroxy: 34 ng/mL (ref 30–100)

## 2021-06-28 ENCOUNTER — Encounter: Payer: Self-pay | Admitting: Adult Health

## 2021-06-28 ENCOUNTER — Ambulatory Visit: Payer: BC Managed Care – PPO | Admitting: Adult Health

## 2021-06-28 VITALS — BP 100/70 | HR 67 | Temp 97.5°F | Wt 132.0 lb

## 2021-06-28 DIAGNOSIS — G4700 Insomnia, unspecified: Secondary | ICD-10-CM | POA: Diagnosis not present

## 2021-06-28 DIAGNOSIS — F3341 Major depressive disorder, recurrent, in partial remission: Secondary | ICD-10-CM

## 2021-06-28 DIAGNOSIS — F411 Generalized anxiety disorder: Secondary | ICD-10-CM

## 2021-06-28 MED ORDER — TRAZODONE HCL 50 MG PO TABS: ORAL_TABLET | ORAL | 1 refills | Status: DC

## 2021-06-28 NOTE — Progress Notes (Signed)
Assessment and Plan:   Victoria Ashley was seen today for follow-up.  Diagnoses and all orders for this visit:  GAD (generalized anxiety disorder) Depression, major, recurrent, in partial remission (HCC) Insomnia, unspecified type Mood/sleep improved with addition of trazodone and seroquel to sertraline; continue; can try trazodone 75 mg and seroquel 12.5 mg to see if this providers restorative sleep but less SE Stress management techniques discussed, increase water, good sleep hygiene discussed, increase exercise, and increase veggies.  -     traZODone (DESYREL) 50 MG tablet; Take 1-1.5 tabs 1 hour prior to bedtime as needed for sleep. -     QUEtiapine (SEROQUEL) 25 MG tablet; Take 0.5-1 tablets (12.5-25 mg total) by mouth at bedtime.  BMI 22 Stable off of remeron  Continue to recommend diet heavy in fruits and veggies and low in animal meats, cheeses, and dairy products, appropriate calorie intake Discuss exercise recommendations routinely Continue to monitor weight at each visit   Discussed med's effects and SE's.   Over 20 minutes of exam, counseling, chart review, and critical decision making was performed.   Future Appointments  Date Time Provider Department Center  12/17/2021  2:00 PM Revonda Humphrey, NP [facility] None   ------------------------------------------------------------------------------------------------------------------   HPI 46 y.o.female with GAD, major depression, insomnia, very low body weight, vitamin D def and osteoporosis.   She is an adoptive mother of toddler twins, boy and girl, 2.5 years.   She has GAD and MDD, has been on prozac, lexapro, celexa, effexor, trazodone in the past. Has required high doses for benefit. Most recently on sertraline 100 mg daily,  also was remeron due to severely low body weight and sleep with improvement but per her preference has tapered off and has been back on trazodone, taking 50 mg with benefit, found 100 mg was excessively  sedating/caused AM sedation, but still fatigue and poor mood. Added seroquel last visit, reports tolerating 25 mg well with benefit other than some hot flashes.   BMI is Body mass index is 22.66 kg/m., she gained 31 lb with addition of remeron, much improved mood and appetite but tapered off. Has maintained weight, stable.  Wt Readings from Last 3 Encounters:  06/28/21 132 lb (59.9 kg)  05/17/21 133 lb (60.3 kg)  12/14/20 130 lb (59 kg)   Patient is on Vitamin D supplement, restarted, feeling much better Lab Results  Component Value Date   VD25OH 34 05/17/2021     On supplement, hasn't changed dose Lab Results  Component Value Date   VITAMINB12 883 08/09/2020     Past Medical History:  Diagnosis Date   BMI less than 19,adult 04/17/2020   Low body weight due to inadequate caloric intake    Mild concussion 2022     No Known Allergies  Current Outpatient Medications on File Prior to Visit  Medication Sig   baclofen (LIORESAL) 10 MG tablet Take 1 tablet (10 mg total) by mouth 3 (three) times daily.   ESTRADIOL PO Take by mouth daily.   QUEtiapine (SEROQUEL) 25 MG tablet Take 0.5-1 tablets (12.5-25 mg total) by mouth at bedtime.   sertraline (ZOLOFT) 100 MG tablet TAKE 1 TABLET BY MOUTH EVERY DAY   traZODone (DESYREL) 50 MG tablet Take 1-3 tabs 1 hour prior to bedtime as needed for sleep.   VITAMIN D, CHOLECALCIFEROL, PO Take 5,000 Units by mouth.   Current Facility-Administered Medications on File Prior to Visit  Medication   ipratropium-albuterol (DUONEB) 0.5-2.5 (3) MG/3ML nebulizer solution 3 mL  ROS: all negative except above.   Physical Exam:  BP 100/70   Pulse 67   Temp (!) 97.5 F (36.4 C)   Wt 132 lb (59.9 kg)   SpO2 97%   BMI 22.66 kg/m   General Appearance: Well dressed, well groomed thin adult female, in no apparent distress. Eyes: PERRL, conjunctiva no swelling or erythema ENT/Mouth: Mask in place; Hearing normal.  Neck: Supple Respiratory:  Respiratory effort normal Cardio: Appears well perfused Lymphatics: Non tender without lymphadenopathy.  Musculoskeletal: no obvious deformity, normal gait.  Skin: Warm, dry without rashes, lesions, ecchymosis.  Neuro: Normal muscle tone Psych: Awake and oriented X 3, normal affect, Insight and Judgment appropriate.     Izora Ribas, NP 11:16 AM [facility]

## 2021-07-03 ENCOUNTER — Other Ambulatory Visit: Payer: Self-pay | Admitting: Adult Health

## 2021-07-03 DIAGNOSIS — F411 Generalized anxiety disorder: Secondary | ICD-10-CM

## 2021-07-25 ENCOUNTER — Encounter: Payer: Self-pay | Admitting: Orthopaedic Surgery

## 2021-07-25 ENCOUNTER — Ambulatory Visit: Payer: BC Managed Care – PPO | Admitting: Orthopaedic Surgery

## 2021-07-25 DIAGNOSIS — M5441 Lumbago with sciatica, right side: Secondary | ICD-10-CM

## 2021-07-25 MED ORDER — BACLOFEN 10 MG PO TABS: 10.0000 mg | ORAL_TABLET | Freq: Three times a day (TID) | ORAL | 1 refills | Status: DC

## 2021-07-25 MED ORDER — PREDNISONE 50 MG PO TABS: ORAL_TABLET | ORAL | 0 refills | Status: DC

## 2021-07-25 NOTE — Progress Notes (Signed)
The patient is well-known to [place].  She is only 46 years old and last year had a discectomy by Dr. Wynetta Emery at L3 to the left side due to an acute herniated disc that was quite symptomatic and failed conservative treatment.  She had done very well from that surgery and had no issues until [facility] Day weekend and when she went to a water park and after that felt something happen to her back and she has had acute right-sided low back pain that does radiate into her backside on the right side.  She started to have some left-sided pain as well.  She has not really had any other conservative treatment but even seeing her today in the office she appears quite uncomfortable.  On exam she has a positive straight leg raise to the right side with no weakness but she has a hard time walking standing straight up and does show significant signs of sciatica to the right side and low back pain to the right side.  I am going to put her on 50 mg of prednisone for 5 days as well as baclofen as a muscle relaxant.  I suggested that she contact Dr. Lonie Peak office to be seen soon for further ration and treatment because there is a good chance that she may have a new herniation in the lumbar spine.  If she has a problem getting appointment with them she will let us know and we can set that up.

## 2021-08-13 ENCOUNTER — Other Ambulatory Visit: Payer: Self-pay | Admitting: Adult Health

## 2021-08-13 DIAGNOSIS — G4700 Insomnia, unspecified: Secondary | ICD-10-CM

## 2021-08-13 DIAGNOSIS — F3341 Major depressive disorder, recurrent, in partial remission: Secondary | ICD-10-CM

## 2021-10-09 ENCOUNTER — Other Ambulatory Visit: Payer: Self-pay

## 2021-10-09 DIAGNOSIS — F411 Generalized anxiety disorder: Secondary | ICD-10-CM

## 2021-10-09 DIAGNOSIS — G4700 Insomnia, unspecified: Secondary | ICD-10-CM

## 2021-10-09 MED ORDER — TRAZODONE HCL 50 MG PO TABS: ORAL_TABLET | ORAL | 1 refills | Status: DC

## 2021-10-21 ENCOUNTER — Other Ambulatory Visit: Payer: Self-pay | Admitting: Orthopaedic Surgery

## 2021-11-08 ENCOUNTER — Encounter: Payer: BC Managed Care – PPO | Admitting: Nurse Practitioner

## 2021-11-15 ENCOUNTER — Other Ambulatory Visit: Payer: Self-pay | Admitting: Nurse Practitioner

## 2021-11-15 ENCOUNTER — Telehealth: Payer: Self-pay | Admitting: Nurse Practitioner

## 2021-11-15 DIAGNOSIS — G4700 Insomnia, unspecified: Secondary | ICD-10-CM

## 2021-11-15 DIAGNOSIS — F3341 Major depressive disorder, recurrent, in partial remission: Secondary | ICD-10-CM

## 2021-11-15 MED ORDER — QUETIAPINE FUMARATE 25 MG PO TABS: ORAL_TABLET | ORAL | 0 refills | Status: DC

## 2021-11-26 ENCOUNTER — Other Ambulatory Visit: Payer: Self-pay | Admitting: Nurse Practitioner

## 2021-11-26 DIAGNOSIS — F3341 Major depressive disorder, recurrent, in partial remission: Secondary | ICD-10-CM

## 2021-11-26 DIAGNOSIS — G4700 Insomnia, unspecified: Secondary | ICD-10-CM

## 2021-11-27 DIAGNOSIS — H60592 Other noninfective acute otitis externa, left ear: Secondary | ICD-10-CM | POA: Diagnosis not present

## 2021-12-13 NOTE — Progress Notes (Deleted)
Complete Physical  Assessment and Plan: Encounter for general adult medical examination with abnormal findings Will order mammogram Refuses Flu due to upcoming surgery  Abnormal Glucose Continue diet and exercise - A1c  Depression, major, recurrent, in partial remission (HCC) Continue Zoloft 100 mg QD   RLS (restless legs syndrome) Has tried Gabapentin in the past but is not having as many problems with RLS currently - get back to working out  Insomnia, unspecified type Can do XANAX PRN for sleep and trazodone  Migraine without status migrainosus, not intractable, unspecified migraine type  Currently well controlled  Vaginal atrophy -    Had Monalisa procedure and is doing well.  Osteopenia, unspecified location -     DEXA 04/2020 osteopenia Continue Vit D3 and K2  Mixed hyperlipidemia Continue diet and exercise -     Lipid panel  Vitamin D deficiency -     VITAMIN D 25 Hydroxy (Vit-D Deficiency, Fractures)  Medication management -     CBC with Differential/Platelet -     CMP -     Magnesium  Screening for ischemic heart disease - EKG  Screening for hematuria or proteinuria -     Urinalysis, Routine w reflex microscopic -     Microalbumin / creatinine urine ratio  Screening for thyroid disorder - TSH   Chronic obstructive pulmonary disease, unspecified COPD type (HCC) Advised to stop smoking - risks discussed. Declines medications.  CXR 05/04/20 Normal Currently stable without respiratory medications   Discussed med's effects and SE's. Screening labs and tests as requested with regular follow-up as recommended. Over 40 minutes of exam, counseling, chart review, and complex, high level critical decision making was performed this visit.   HPI  46 y.o. female  presents for a complete physical and follow up for has Depression, major, recurrent, in partial remission (HCC); RLS (restless legs syndrome); Insomnia; Osteopenia; COPD (chronic obstructive pulmonary  disease) (HCC); Vitamin D deficiency; Migraine without status migrainosus, not intractable; Post-menopausal; History of colitis; GAD (generalized anxiety disorder); Cigarette nicotine dependence without complication; Genetic testing; Chronic left-sided low back pain with left-sided sciatica; B12 deficiency; BMI 22.0-22.9, adult; and Mixed hyperlipidemia on their problem list..  Her blood pressure has been controlled at home, today their BP is   She does not workout, due to back pain right now. She denies chest pain, shortness of breath, dizziness.   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 Does not need fusion, may have partial discectomy.  She does have aching pain in lumbar area with radiation of pain down both legs.  She has a history of depression, anxiety, she has tried and failed lexapro, effexor, wellbutrin, paxil, prozac. She is currently on zoloft and is working well.  She is still smoking, she is only smoking 3-4 cigarettes a day.   She did have Monalisa procedure and pelvic floor training. Doing well currently.  She has empty egg syndrome and early menopause, strong family history of breast cancer and grandmother with ovarian cancer.  MGM 2021 Mammogram with mobile mammography She had genetic counseling with [facility] that was negative, 01/2016.   She is on vitamin D and B12 supplement.  Lab Results  Component Value Date   VITAMINB12 883 08/09/2020   Lab Results  Component Value Date   VD25OH 34 05/17/2021   BMI is There is no height or weight on file to calculate BMI., she is not working out so she has had weight loss. Wt Readings  from Last 3 Encounters:  06/28/21 132 lb (59.9 kg)  05/17/21 133 lb (60.3 kg)  12/14/20 130 lb (59 kg)     Current Medications:   Current Outpatient Medications (Endocrine & Metabolic):    ESTRADIOL PO, Take by mouth daily.   predniSONE (DELTASONE) 50 MG tablet, Take one tablet daily for 5  days.      Current Facility-Administered Medications (Respiratory):    ipratropium-albuterol (DUONEB) 0.5-2.5 (3) MG/3ML nebulizer solution 3 mL      Current Outpatient Medications (Other):    baclofen (LIORESAL) 10 MG tablet, TAKE 1 TABLET BY MOUTH THREE TIMES A DAY   QUEtiapine (SEROQUEL) 25 MG tablet, TAKE 1/2 - 1 TABLET (12.5-25 MG TOTAL) BY MOUTH AT BEDTIME.   sertraline (ZOLOFT) 100 MG tablet, TAKE 1 TABLET BY MOUTH EVERY DAY   traZODone (DESYREL) 50 MG tablet, Take 1-1.5 tabs 1 hour prior to bedtime as needed for sleep.   VITAMIN D, CHOLECALCIFEROL, PO, Take 5,000 Units by mouth.  Allergies:  No Known Allergies   Medical History:  She has Depression, major, recurrent, in partial remission (HCC); RLS (restless legs syndrome); Insomnia; Osteopenia; COPD (chronic obstructive pulmonary disease) (HCC); Vitamin D deficiency; Migraine without status migrainosus, not intractable; Post-menopausal; History of colitis; GAD (generalized anxiety disorder); Cigarette nicotine dependence without complication; Genetic testing; Chronic left-sided low back pain with left-sided sciatica; B12 deficiency; BMI 22.0-22.9, adult; and Mixed hyperlipidemia on their problem list.   Health Maintenance:   Health Maintenance  Topic Date Due   Hepatitis C Screening  Never done   TETANUS/TDAP  Never done   PAP SMEAR-Modifier  09/04/2017   COVID-19 Vaccine (4 - Pfizer risk series) 02/06/2020   COLONOSCOPY (Pts 45-28yrs Insurance coverage will need to be confirmed)  Never done   INFLUENZA VACCINE  09/04/2021   HIV Screening  Completed   HPV VACCINES  Aged Out    Tetanus: will get soon   Pneumovax: declines Prevnar 13: declines Flu vaccine: declines Zostavax: declines  No LMP recorded. Patient is premenopausal. Pap: 2018- Dr. Tenny Craw- neg HPV due 2023 MGM: 2021, due now DEXA: 04/2020 osteopenia Colonoscopy: 2017 EGD: N/A : Patient Care Team: Lucky Cowboy, MD as PCP - General (Internal  Medicine)  Surgical History:  She has a past surgical history that includes Posterior laminectomy / decompression lumbar spine (N/A, 01/03/2021).   Family History:  Herfamily history includes Alcohol abuse in her father; Breast cancer in her mother; Cancer (age of onset: 51) in her mother; Cancer (age of onset: 38) in her maternal grandmother; Diabetes (age of onset: 33) in her father; Heart disease in her maternal grandfather.   Social History:  She reports that she has been smoking cigarettes. She has a 5.00 pack-year smoking history. She has never used smokeless tobacco. She reports that she does not drink alcohol and does not use drugs.  Review of Systems: Review of Systems  Constitutional: Negative.  Negative for chills and fever.  HENT: Negative.  Negative for congestion, hearing loss, sinus pain, sore throat and tinnitus.   Eyes: Negative.  Negative for blurred vision and double vision.  Respiratory: Negative.  Negative for cough, hemoptysis, sputum production, shortness of breath and wheezing.   Cardiovascular: Negative.  Negative for chest pain, palpitations and leg swelling.  Gastrointestinal: Negative.  Negative for abdominal pain, constipation, diarrhea, heartburn, nausea and vomiting.  Genitourinary: Negative.  Negative for dysuria and urgency.  Musculoskeletal:  Positive for back pain. Negative for falls, joint pain, myalgias and neck pain.  Skin: Negative.  Negative for rash.  Neurological:  Negative for dizziness, tingling, tremors, weakness and headaches.  Endo/Heme/Allergies:  Does not bruise/bleed easily.  Psychiatric/Behavioral:  Negative for depression and suicidal ideas. The patient has insomnia. The patient is not nervous/anxious.     Physical Exam: Estimated body mass index is 22.66 kg/m as calculated from the following:   Height as of 12/14/20: 5\' 4"  (1.626 m).   Weight as of 06/28/21: 132 lb (59.9 kg). There were no vitals taken for this visit. General  Appearance: Well nourished, in no apparent distress.  Eyes: PERRLA, EOMs, conjunctiva no swelling or erythema, normal fundi and vessels.  Sinuses: No Frontal/maxillary tenderness  ENT/Mouth: Ext aud canals clear, normal light reflex with TMs without erythema, bulging. Good dentition. No erythema, swelling, or exudate on post pharynx. Tonsils not swollen or erythematous. Hearing normal.  Neck: Supple, thyroid normal. No bruits  Respiratory: Respiratory effort normal, BS equal bilaterally without rales, rhonchi, wheezing or stridor.  Cardio: RRR without murmurs, rubs or gallops. Brisk peripheral pulses without edema.  Chest: symmetric, with normal excursions and percussion.  Breasts: Defer  Abdomen: Soft, nontender, no guarding, rebound, hernias, masses, or organomegaly.  Lymphatics: Non tender without lymphadenopathy.  Genitourinary: defer Musculoskeletal: Full ROM all peripheral extremities. Skin: 2 mm dark nevus, good borders, unchanged on right flank, 1.5 mm new center of her back. Warm, dry without rashes, lesions, ecchymosis. Neuro: Cranial nerves intact, reflexes equal bilaterally. Normal muscle tone, no cerebellar symptoms. Sensation intact.  Psych: Awake and oriented X 3, normal affect, Insight and Judgment appropriate.    EKG: NSR  Ezell Melikian E  11:07 AM [facility]

## 2021-12-17 ENCOUNTER — Encounter: Payer: BC Managed Care – PPO | Admitting: Nurse Practitioner

## 2021-12-17 DIAGNOSIS — G43909 Migraine, unspecified, not intractable, without status migrainosus: Secondary | ICD-10-CM

## 2021-12-17 DIAGNOSIS — N952 Postmenopausal atrophic vaginitis: Secondary | ICD-10-CM

## 2021-12-17 DIAGNOSIS — J449 Chronic obstructive pulmonary disease, unspecified: Secondary | ICD-10-CM

## 2021-12-17 DIAGNOSIS — F411 Generalized anxiety disorder: Secondary | ICD-10-CM

## 2021-12-17 DIAGNOSIS — Z0001 Encounter for general adult medical examination with abnormal findings: Secondary | ICD-10-CM

## 2021-12-17 DIAGNOSIS — Z136 Encounter for screening for cardiovascular disorders: Secondary | ICD-10-CM

## 2021-12-17 DIAGNOSIS — Z1389 Encounter for screening for other disorder: Secondary | ICD-10-CM

## 2021-12-17 DIAGNOSIS — E559 Vitamin D deficiency, unspecified: Secondary | ICD-10-CM

## 2021-12-17 DIAGNOSIS — G2581 Restless legs syndrome: Secondary | ICD-10-CM

## 2021-12-17 DIAGNOSIS — E782 Mixed hyperlipidemia: Secondary | ICD-10-CM

## 2021-12-17 DIAGNOSIS — R7309 Other abnormal glucose: Secondary | ICD-10-CM

## 2021-12-17 DIAGNOSIS — F3341 Major depressive disorder, recurrent, in partial remission: Secondary | ICD-10-CM

## 2021-12-17 DIAGNOSIS — F17210 Nicotine dependence, cigarettes, uncomplicated: Secondary | ICD-10-CM

## 2021-12-17 DIAGNOSIS — Z79899 Other long term (current) drug therapy: Secondary | ICD-10-CM

## 2021-12-17 DIAGNOSIS — M8580 Other specified disorders of bone density and structure, unspecified site: Secondary | ICD-10-CM

## 2021-12-17 DIAGNOSIS — G4700 Insomnia, unspecified: Secondary | ICD-10-CM

## 2021-12-17 DIAGNOSIS — Z1329 Encounter for screening for other suspected endocrine disorder: Secondary | ICD-10-CM

## 2021-12-17 DIAGNOSIS — Z6822 Body mass index (BMI) 22.0-22.9, adult: Secondary | ICD-10-CM

## 2021-12-19 DIAGNOSIS — Z03818 Encounter for observation for suspected exposure to other biological agents ruled out: Secondary | ICD-10-CM | POA: Diagnosis not present

## 2021-12-19 DIAGNOSIS — J209 Acute bronchitis, unspecified: Secondary | ICD-10-CM | POA: Diagnosis not present

## 2021-12-19 DIAGNOSIS — J069 Acute upper respiratory infection, unspecified: Secondary | ICD-10-CM | POA: Diagnosis not present

## 2022-01-02 ENCOUNTER — Other Ambulatory Visit: Payer: Self-pay | Admitting: Orthopaedic Surgery

## 2022-02-14 ENCOUNTER — Other Ambulatory Visit: Payer: Self-pay | Admitting: Orthopaedic Surgery

## 2022-04-08 ENCOUNTER — Other Ambulatory Visit: Payer: Self-pay | Admitting: Nurse Practitioner

## 2022-04-08 DIAGNOSIS — G4700 Insomnia, unspecified: Secondary | ICD-10-CM

## 2022-04-09 ENCOUNTER — Other Ambulatory Visit: Payer: Self-pay | Admitting: Internal Medicine

## 2022-04-15 NOTE — Progress Notes (Unsigned)
Complete Physical  Assessment and Plan: Encounter for general adult medical examination with abnormal findings Will order mammogram Refuses Flu due to upcoming surgery   Depression, major, recurrent, in partial remission (HCC) Continue Zoloft 100 mg QD   RLS (restless legs syndrome) Has tried Gabapentin in the past but is not having as many problems with RLS currently - get back to working out  Insomnia, unspecified type Can do XANAX PRN for sleep and trazodone  Migraine without status migrainosus, not intractable, unspecified migraine type  Currently well controlled  Hyperlipidemia Continue diet and exercise - lipid panel  Vaginal atrophy -    Had Monalisa procedure and is doing well.  Osteopenia, unspecified location -     DEXA 04/2020 osteopenia Continue Vit D3 and K2  Screening cholesterol level -     Lipid panel  Vitamin D deficiency -     VITAMIN D 25 Hydroxy (Vit-D Deficiency, Fractures)  Medication management -     CBC with Differential/Platelet -     CMP -     Magnesium  Screening for hematuria or proteinuria -     Urinalysis, Routine w reflex microscopic -     Microalbumin / creatinine urine ratio  Screening for diabetes mellitus - A1c  Screening for thyroid disorder - TSH  Chronic obstructive pulmonary disease, unspecified COPD type ([place]) Advised to stop smoking - risks discussed. Declines medications.  CXR 05/04/20 Normal Currently stable without respiratory medications   Discussed med's effects and SE's. Screening labs and tests as requested with regular follow-up as recommended. Over 40 minutes of exam, counseling, chart review, and complex, high level critical decision making was performed this visit.   HPI  47 y.o. female  presents for a complete physical and follow up for has Depression, major, recurrent, in partial remission ([place]); RLS (restless legs syndrome); Insomnia; Osteopenia; COPD (chronic obstructive pulmonary disease) ([place]);  Vitamin D deficiency; Migraine without status migrainosus, not intractable; Post-menopausal; History of colitis; GAD (generalized anxiety disorder); Cigarette nicotine dependence without complication; Genetic testing; Chronic left-sided low back pain with left-sided sciatica; B12 deficiency; BMI 22.0-22.9, adult; and Mixed hyperlipidemia on their problem list..  Her blood pressure has been controlled at home, today their BP is   She does not workout, due to back pain right now. She denies chest pain, shortness of breath, dizziness.   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 Does not need fusion, may have partial discectomy.  She does have aching pain in lumbar area with radiation of pain down both legs.  She has a history of depression, anxiety, she has tried and failed lexapro, effexor, wellbutrin, paxil, prozac. She is currently on zoloft and is working well.  She is still smoking, she is only smoking 3-4 cigarettes a day.   She did have Monalisa procedure and pelvic floor training. Doing well currently.  She has empty egg syndrome and early menopause, strong family history of breast cancer and grandmother with ovarian cancer.  MGM 2021 Mammogram with mobile mammography She had genetic counseling with [facility] that was negative, 01/2016.   She is on vitamin D and B12 supplement.  Lab Results  Component Value Date   [id number] 08/09/2020   Lab Results  Component Value Date   VD25OH 34 05/17/2021   BMI is There is no height or weight on file to calculate BMI., she is not working out so she has had weight loss. Wt Readings from Last 3 Encounters:  06/28/21 132 lb (59.9 kg)  05/17/21 133 lb (60.3 kg)  12/14/20 130 lb (59 kg)     Current Medications:   Current Outpatient Medications (Endocrine & Metabolic):    ESTRADIOL PO, Take by mouth daily.      Current Outpatient Medications (Other):    baclofen (LIORESAL) 10 MG tablet, TAKE 1 TABLET  BY MOUTH THREE TIMES A DAY   QUEtiapine (SEROQUEL) 25 MG tablet, TAKE 1/2 - 1 TABLET (12.5-25 MG TOTAL) BY MOUTH AT BEDTIME.   sertraline (ZOLOFT) 100 MG tablet, TAKE 1 TABLET BY MOUTH EVERY DAY   traZODone (DESYREL) 50 MG tablet, TAKE 1-1.5 TABS 1 HOUR PRIOR TO BEDTIME AS NEEDED FOR SLEEP.   VITAMIN D, CHOLECALCIFEROL, PO, Take 5,000 Units by mouth.  Allergies:  No Known Allergies   Medical History:  She has Depression, major, recurrent, in partial remission ([place]); RLS (restless legs syndrome); Insomnia; Osteopenia; COPD (chronic obstructive pulmonary disease) ([place]); Vitamin D deficiency; Migraine without status migrainosus, not intractable; Post-menopausal; History of colitis; GAD (generalized anxiety disorder); Cigarette nicotine dependence without complication; Genetic testing; Chronic left-sided low back pain with left-sided sciatica; B12 deficiency; BMI 22.0-22.9, adult; and Mixed hyperlipidemia on their problem list.   Health Maintenance:   Health Maintenance  Topic Date Due   Hepatitis C Screening  Never done   DTaP/Tdap/Td (1 - Tdap) Never done   PAP SMEAR-Modifier  09/04/2017   COLONOSCOPY (Pts 45-54yr Insurance coverage will need to be confirmed)  Never done   INFLUENZA VACCINE  09/04/2021   COVID-19 Vaccine (4 - 2023-24 season) 10/05/2021   HIV Screening  Completed   HPV VACCINES  Aged Out    Tetanus: will get soon   Pneumovax: declines Prevnar 13: declines Flu vaccine: declines Zostavax: declines  No LMP recorded. Patient is premenopausal. Pap: 2018- Dr. RHarrington Challenger neg HPV due 2023 MGM: 2021, due now DEXA: 04/2020 osteopenia Colonoscopy: 2017 EGD: N/A : Patient Care Team: MUnk Pinto MD as PCP - General (Internal Medicine)  Surgical History:  She has a past surgical history that includes Posterior laminectomy / decompression lumbar spine (N/A, 01/03/2021).   Family History:  Herfamily history includes Alcohol abuse in her father; Breast cancer in her mother;  Cancer (age of onset: 547 in her mother; Cancer (age of onset: 554 in her maternal grandmother; Diabetes (age of onset: 44 in her father; Heart disease in her maternal grandfather.   Social History:  She reports that she has been smoking cigarettes. She has a 5.00 pack-year smoking history. She has never used smokeless tobacco. She reports that she does not drink alcohol and does not use drugs.  Review of Systems: Review of Systems  Constitutional: Negative.  Negative for chills and fever.  HENT: Negative.  Negative for congestion, hearing loss, sinus pain, sore throat and tinnitus.   Eyes: Negative.  Negative for blurred vision and double vision.  Respiratory: Negative.  Negative for cough, hemoptysis, sputum production, shortness of breath and wheezing.   Cardiovascular: Negative.  Negative for chest pain, palpitations and leg swelling.  Gastrointestinal: Negative.  Negative for abdominal pain, constipation, diarrhea, heartburn, nausea and vomiting.  Genitourinary: Negative.  Negative for dysuria and urgency.  Musculoskeletal:  Positive for back pain. Negative for falls, joint pain, myalgias and neck pain.  Skin: Negative.  Negative for rash.  Neurological:  Negative for dizziness, tingling, tremors, weakness and headaches.  Endo/Heme/Allergies:  Does not bruise/bleed easily.  Psychiatric/Behavioral:  Negative for depression and suicidal ideas. The patient has insomnia. The patient  is not nervous/anxious.     Physical Exam: Estimated body mass index is 22.66 kg/m as calculated from the following:   Height as of 12/14/20: '5\' 4"'$  (1.626 m).   Weight as of 06/28/21: 132 lb (59.9 kg). There were no vitals taken for this visit. General Appearance: Well nourished, in no apparent distress.  Eyes: PERRLA, EOMs, conjunctiva no swelling or erythema, normal fundi and vessels.  Sinuses: No Frontal/maxillary tenderness  ENT/Mouth: Ext aud canals clear, normal light reflex with TMs without erythema,  bulging. Good dentition. No erythema, swelling, or exudate on post pharynx. Tonsils not swollen or erythematous. Hearing normal.  Neck: Supple, thyroid normal. No bruits  Respiratory: Respiratory effort normal, BS equal bilaterally without rales, rhonchi, wheezing or stridor.  Cardio: RRR without murmurs, rubs or gallops. Brisk peripheral pulses without edema.  Chest: symmetric, with normal excursions and percussion.  Breasts: Defer  Abdomen: Soft, nontender, no guarding, rebound, hernias, masses, or organomegaly.  Lymphatics: Non tender without lymphadenopathy.  Genitourinary: defer Musculoskeletal: Full ROM all peripheral extremities. Skin: 2 mm dark nevus, good borders, unchanged on right flank, 1.5 mm new center of her back. Warm, dry without rashes, lesions, ecchymosis. Neuro: Cranial nerves intact, reflexes equal bilaterally. Normal muscle tone, no cerebellar symptoms. Sensation intact.  Psych: Awake and oriented X 3, normal affect, Insight and Judgment appropriate.    EKG: NSR  Hortence Charter E  11:11 AM [facility]

## 2022-04-16 ENCOUNTER — Ambulatory Visit: Payer: No Typology Code available for payment source | Admitting: Nurse Practitioner

## 2022-04-16 ENCOUNTER — Encounter: Payer: Self-pay | Admitting: Nurse Practitioner

## 2022-04-16 VITALS — BP 104/62 | HR 60 | Temp 97.9°F | Ht 64.0 in | Wt 117.2 lb

## 2022-04-16 DIAGNOSIS — Z1389 Encounter for screening for other disorder: Secondary | ICD-10-CM

## 2022-04-16 DIAGNOSIS — Z1329 Encounter for screening for other suspected endocrine disorder: Secondary | ICD-10-CM

## 2022-04-16 DIAGNOSIS — N952 Postmenopausal atrophic vaginitis: Secondary | ICD-10-CM

## 2022-04-16 DIAGNOSIS — Z1211 Encounter for screening for malignant neoplasm of colon: Secondary | ICD-10-CM

## 2022-04-16 DIAGNOSIS — G4700 Insomnia, unspecified: Secondary | ICD-10-CM

## 2022-04-16 DIAGNOSIS — Z79899 Other long term (current) drug therapy: Secondary | ICD-10-CM

## 2022-04-16 DIAGNOSIS — F3341 Major depressive disorder, recurrent, in partial remission: Secondary | ICD-10-CM

## 2022-04-16 DIAGNOSIS — Z0000 Encounter for general adult medical examination without abnormal findings: Secondary | ICD-10-CM

## 2022-04-16 DIAGNOSIS — G43909 Migraine, unspecified, not intractable, without status migrainosus: Secondary | ICD-10-CM

## 2022-04-16 DIAGNOSIS — E782 Mixed hyperlipidemia: Secondary | ICD-10-CM

## 2022-04-16 DIAGNOSIS — J449 Chronic obstructive pulmonary disease, unspecified: Secondary | ICD-10-CM

## 2022-04-16 DIAGNOSIS — Z131 Encounter for screening for diabetes mellitus: Secondary | ICD-10-CM

## 2022-04-16 DIAGNOSIS — F17210 Nicotine dependence, cigarettes, uncomplicated: Secondary | ICD-10-CM

## 2022-04-16 DIAGNOSIS — M8580 Other specified disorders of bone density and structure, unspecified site: Secondary | ICD-10-CM

## 2022-04-16 DIAGNOSIS — Z0001 Encounter for general adult medical examination with abnormal findings: Secondary | ICD-10-CM

## 2022-04-16 DIAGNOSIS — F411 Generalized anxiety disorder: Secondary | ICD-10-CM

## 2022-04-16 DIAGNOSIS — E441 Mild protein-calorie malnutrition: Secondary | ICD-10-CM

## 2022-04-16 DIAGNOSIS — G2581 Restless legs syndrome: Secondary | ICD-10-CM

## 2022-04-16 DIAGNOSIS — E559 Vitamin D deficiency, unspecified: Secondary | ICD-10-CM

## 2022-04-16 MED ORDER — VORTIOXETINE HBR 5 MG PO TABS: 5.0000 mg | ORAL_TABLET | Freq: Every day | ORAL | 0 refills | Status: DC

## 2022-04-16 NOTE — Patient Instructions (Signed)
Malnutrition, Adult ?Malnutrition is a group of symptoms that affects adults, including the elderly. These symptoms include eating too little and losing weight. People who have this may not want to be with friends, or they may not want to eat or drink. This condition is not a normal part of getting older. ?What are the causes? ?A disease, such as dementia, diabetes, cancer, or lung disease. ?A health problem, such as a vitamin deficiency or a heart problem. ?A disorder, such as sadness (depression). ?A disability. ?Medicines. ?Having tooth or mouth problems. ?Neglect or being treated badly. ?In some cases, the cause may not be known. ?What are the signs or symptoms? ?Loss of more than 5% of your body weight. ?Being more tired than normal after an activity. ?Having trouble getting up after sitting. ?Not feeling hungry. ?Not getting out of bed. ?Not wanting to do your normal activities. ?Sadness. ?Getting infections often. ?Bedsores. ?Taking a long time to get better after an infection, injury, or a surgery. ?Weakness. ?How is this treated? ?Treatment for this condition depends on the cause. It may be treated by: ?Treating a disease or disorder that is causing symptoms. ?Having talk therapy or taking medicine to treat sadness. ?Eating better, such as eating more often or taking nutritional supplements. ?Changing or stopping a medicine. ?Having physical or occupational therapy. ?It often takes a team of doctors to find the right treatment. ?Follow these instructions at home: ? ?Take over-the-counter and prescription medicines only as told by your doctor. ?Eat a healthy diet. Make sure that you eat enough. Ask your doctor how much you should eat. ?Be active. Do exercises that make you stronger (strength training). A physical therapist can help to set up a program that fits you. ?Make sure that you are safe at home. ?Have a plan for what to do if you cannot make decisions for yourself. ?Contact a doctor if: ?You are not  able to eat well. ?You are not able to move around. ?You feel very sad. ?You feel very hopeless. ?Get help right away if: ?You think about ending your life. ?You cannot eat or drink. ?You do not get out of bed. ?Staying at home is not safe. ?You have a fever. ?Get help right away if you feel like you may hurt yourself or others, or have thoughts about taking your own life. Go to your nearest emergency room or: ?Call 911. ?Call the National Suicide Prevention Lifeline at 1-800-273-8255 or 988. This is open 24 hours a day. ?Text the Crisis Text Line at 741741. ?Summary ?Malnutrition is a group of symptoms that affect adults, including the elderly. ?Symptoms include eating too little and losing weight. ?Take all medicines only as told by your doctor. ?Eat a healthy diet. Make sure that you eat enough. Ask your doctor how much you should eat. ?Be active. Do exercises that make you stronger. A physical therapist can help to set up a program that is right for you. ?This information is not intended to replace advice given to you by your health care provider. Make sure you discuss any questions you have with your health care provider. ?Document Revised: 10/06/2020 Document Reviewed: 10/06/2020 ?Elsevier Patient Education ? 2023 Elsevier Inc. ? ?

## 2022-04-17 LAB — LIPID PANEL
Cholesterol: 182 mg/dL (ref <200)
HDL: 64 mg/dL (ref >=50)
LDL Cholesterol (Calc): 104 mg/dL (calc) — ABNORMAL HIGH
Non-HDL Cholesterol (Calc): 118 mg/dL (calc) (ref <130)
Total CHOL/HDL Ratio: 2.8 (calc) (ref <5.0)
Triglycerides: 59 mg/dL (ref <150)

## 2022-04-17 LAB — CBC WITH DIFFERENTIAL/PLATELET
Absolute Monocytes: 689 cells/uL (ref 200–950)
Basophils Absolute: 50 cells/uL (ref 0–200)
Basophils Relative: 0.6 %
Eosinophils Absolute: 282 cells/uL (ref 15–500)
Eosinophils Relative: 3.4 %
HCT: 40.2 % (ref 35.0–45.0)
Hemoglobin: 13.4 g/dL (ref 11.7–15.5)
Lymphs Abs: 2399 cells/uL (ref 850–3900)
MCH: 29.3 pg (ref 27.0–33.0)
MCHC: 33.3 g/dL (ref 32.0–36.0)
MCV: 88 fL (ref 80.0–100.0)
MPV: 10.3 fL (ref 7.5–12.5)
Monocytes Relative: 8.3 %
Neutro Abs: 4880 cells/uL (ref 1500–7800)
Neutrophils Relative %: 58.8 %
Platelets: 228 10*3/uL (ref 140–400)
RBC: 4.57 10*6/uL (ref 3.80–5.10)
RDW: 12.7 % (ref 11.0–15.0)
Total Lymphocyte: 28.9 %
WBC: 8.3 10*3/uL (ref 3.8–10.8)

## 2022-04-17 LAB — URINALYSIS, ROUTINE W REFLEX MICROSCOPIC
Bilirubin Urine: NEGATIVE
Glucose, UA: NEGATIVE
Hgb urine dipstick: NEGATIVE
Ketones, ur: NEGATIVE
Leukocytes,Ua: NEGATIVE
Nitrite: NEGATIVE
Protein, ur: NEGATIVE
Specific Gravity, Urine: 1.017 (ref 1.001–1.035)
pH: 6.5 (ref 5.0–8.0)

## 2022-04-17 LAB — COMPLETE METABOLIC PANEL WITH GFR
AG Ratio: 2 (calc) (ref 1.0–2.5)
ALT: 11 U/L (ref 6–29)
AST: 17 U/L (ref 10–35)
Albumin: 4.7 g/dL (ref 3.6–5.1)
Alkaline phosphatase (APISO): 70 U/L (ref 31–125)
BUN: 12 mg/dL (ref 7–25)
CO2: 28 mmol/L (ref 20–32)
Calcium: 10.1 mg/dL (ref 8.6–10.2)
Chloride: 107 mmol/L (ref 98–110)
Creat: 0.76 mg/dL (ref 0.50–0.99)
Globulin: 2.3 g/dL (calc) (ref 1.9–3.7)
Glucose, Bld: 70 mg/dL (ref 65–99)
Potassium: 4.9 mmol/L (ref 3.5–5.3)
Sodium: 145 mmol/L (ref 135–146)
Total Bilirubin: 0.3 mg/dL (ref 0.2–1.2)
Total Protein: 7 g/dL (ref 6.1–8.1)
eGFR: 98 mL/min/{1.73_m2} (ref >=60)

## 2022-04-17 LAB — HEMOGLOBIN A1C
Hgb A1c MFr Bld: 5.6 % of total Hgb (ref <5.7)
Mean Plasma Glucose: 114 mg/dL
eAG (mmol/L): 6.3 mmol/L

## 2022-04-17 LAB — MICROALBUMIN / CREATININE URINE RATIO
Creatinine, Urine: 156 mg/dL (ref 20–275)
Microalb Creat Ratio: 3 mcg/mg creat (ref <30)
Microalb, Ur: 0.5 mg/dL

## 2022-04-17 LAB — VITAMIN D 25 HYDROXY (VIT D DEFICIENCY, FRACTURES): Vit D, 25-Hydroxy: 79 ng/mL (ref 30–100)

## 2022-04-17 LAB — TSH: TSH: 0.96 mIU/L

## 2022-04-17 LAB — MAGNESIUM: Magnesium: 2 mg/dL (ref 1.5–2.5)

## 2022-04-20 ENCOUNTER — Other Ambulatory Visit: Payer: Self-pay | Admitting: Orthopaedic Surgery

## 2022-04-29 ENCOUNTER — Other Ambulatory Visit: Payer: Self-pay | Admitting: Nurse Practitioner

## 2022-04-29 ENCOUNTER — Encounter: Payer: Self-pay | Admitting: Nurse Practitioner

## 2022-04-29 DIAGNOSIS — F3341 Major depressive disorder, recurrent, in partial remission: Secondary | ICD-10-CM

## 2022-04-29 DIAGNOSIS — F411 Generalized anxiety disorder: Secondary | ICD-10-CM

## 2022-04-29 MED ORDER — SERTRALINE HCL 150 MG PO CAPS: 1.0000 | ORAL_CAPSULE | Freq: Every day | ORAL | 3 refills | Status: DC

## 2022-05-09 ENCOUNTER — Other Ambulatory Visit: Payer: Self-pay | Admitting: Nurse Practitioner

## 2022-05-09 ENCOUNTER — Encounter: Payer: Self-pay | Admitting: Nurse Practitioner

## 2022-05-09 DIAGNOSIS — F3341 Major depressive disorder, recurrent, in partial remission: Secondary | ICD-10-CM

## 2022-05-09 MED ORDER — SERTRALINE HCL 100 MG PO TABS: ORAL_TABLET | ORAL | 2 refills | Status: DC

## 2022-05-12 ENCOUNTER — Other Ambulatory Visit: Payer: Self-pay | Admitting: Nurse Practitioner

## 2022-05-12 DIAGNOSIS — G4700 Insomnia, unspecified: Secondary | ICD-10-CM

## 2022-05-12 DIAGNOSIS — F3341 Major depressive disorder, recurrent, in partial remission: Secondary | ICD-10-CM

## 2022-05-14 LAB — HM MAMMOGRAPHY

## 2022-05-15 LAB — HM PAP SMEAR: HPV, high-risk: NEGATIVE

## 2022-05-15 NOTE — Progress Notes (Deleted)
Assessment and Plan:  There are no diagnoses linked to this encounter.    Further disposition pending results of labs. Discussed med's effects and SE's.   Over 30 minutes of exam, counseling, chart review, and critical decision making was performed.   Future Appointments  Date Time Provider Department Center  05/16/2022 10:30 AM Raynelle Dick, NP [facility] None  04/16/2023 10:00 AM Raynelle Dick, NP [facility] None    ------------------------------------------------------------------------------------------------------------------   HPI There were no vitals taken for this visit. 47 y.o.female presents for  Past Medical History:  Diagnosis Date   BMI less than 19,adult 04/17/2020   Low body weight due to inadequate caloric intake    Mild concussion 2022     No Known Allergies  Current Outpatient Medications on File Prior to Visit  Medication Sig   sertraline (ZOLOFT) 100 MG tablet Take 1 and 1/2 tabs daily   alendronate (FOSAMAX) 70 MG tablet TAKE 1 TABLET BY MOUTH EVERY 7 (SEVEN) DAYS. TAKE WITH A FULL GLASS OF WATER ON AN EMPTY STOMACH. (Patient not taking: Reported on 04/16/2022)   baclofen (LIORESAL) 10 MG tablet TAKE 1 TABLET BY MOUTH THREE TIMES A DAY   ESTRADIOL PO Take by mouth daily.   QUEtiapine (SEROQUEL) 25 MG tablet TAKE 1/2 - 1 TABLET (12.5-25 MG TOTAL) BY MOUTH AT BEDTIME.   traZODone (DESYREL) 50 MG tablet TAKE 1-1.5 TABS 1 HOUR PRIOR TO BEDTIME AS NEEDED FOR SLEEP.   VITAMIN D, CHOLECALCIFEROL, PO Take 5,000 Units by mouth.   No current facility-administered medications on file prior to visit.    ROS: all negative except above.   Physical Exam:  There were no vitals taken for this visit.  General Appearance: Well nourished, in no apparent distress. Eyes: PERRLA, EOMs, conjunctiva no swelling or erythema Sinuses: No Frontal/maxillary tenderness ENT/Mouth: Ext aud canals clear, TMs without erythema, bulging. No erythema, swelling, or exudate on  post pharynx.  Tonsils not swollen or erythematous. Hearing normal.  Neck: Supple, thyroid normal.  Respiratory: Respiratory effort normal, BS equal bilaterally without rales, rhonchi, wheezing or stridor.  Cardio: RRR with no MRGs. Brisk peripheral pulses without edema.  Abdomen: Soft, + BS.  Non tender, no guarding, rebound, hernias, masses. Lymphatics: Non tender without lymphadenopathy.  Musculoskeletal: Full ROM, 5/5 strength, normal gait.  Skin: Warm, dry without rashes, lesions, ecchymosis.  Neuro: Cranial nerves intact. Normal muscle tone, no cerebellar symptoms. Sensation intact.  Psych: Awake and oriented X 3, normal affect, Insight and Judgment appropriate.     Raynelle Dick, NP 12:45 PM [facility]

## 2022-05-16 ENCOUNTER — Ambulatory Visit: Payer: No Typology Code available for payment source | Admitting: Nurse Practitioner

## 2022-05-17 ENCOUNTER — Encounter: Payer: Self-pay | Admitting: Nurse Practitioner

## 2022-06-10 ENCOUNTER — Other Ambulatory Visit: Payer: Self-pay | Admitting: Orthopaedic Surgery

## 2022-08-10 ENCOUNTER — Other Ambulatory Visit: Payer: Self-pay | Admitting: Nurse Practitioner

## 2022-08-10 DIAGNOSIS — F3341 Major depressive disorder, recurrent, in partial remission: Secondary | ICD-10-CM

## 2022-08-10 DIAGNOSIS — G4700 Insomnia, unspecified: Secondary | ICD-10-CM

## 2022-08-13 NOTE — Progress Notes (Unsigned)
Assessment and Plan:  There are no diagnoses linked to this encounter.    Further disposition pending results of labs. Discussed med's effects and SE's.   Over 30 minutes of exam, counseling, chart review, and critical decision making was performed.   Future Appointments  Date Time Provider Department Center  08/14/2022  3:00 PM Victoria Dick, NP [facility] None  04/16/2023 10:00 AM Victoria Dick, NP [facility] None    ------------------------------------------------------------------------------------------------------------------   HPI There were no vitals taken for this visit. 47 y.o.female presents for  Past Medical History:  Diagnosis Date   BMI less than 19,adult 04/17/2020   Low body weight due to inadequate caloric intake    Mild concussion 2022     No Known Allergies  Current Outpatient Medications on File Prior to Visit  Medication Sig   sertraline (ZOLOFT) 100 MG tablet Take 1 and 1/2 tabs daily   alendronate (FOSAMAX) 70 MG tablet TAKE 1 TABLET BY MOUTH EVERY 7 (SEVEN) DAYS. TAKE WITH A FULL GLASS OF WATER ON AN EMPTY STOMACH. (Patient not taking: Reported on 04/16/2022)   baclofen (LIORESAL) 10 MG tablet TAKE 1 TABLET BY MOUTH THREE TIMES A DAY   ESTRADIOL PO Take by mouth daily.   QUEtiapine (SEROQUEL) 25 MG tablet TAKE 1/2 - 1 TABLET (12.5-25 MG TOTAL) BY MOUTH AT BEDTIME.   traZODone (DESYREL) 50 MG tablet TAKE 1-1.5 TABS 1 HOUR PRIOR TO BEDTIME AS NEEDED FOR SLEEP.   VITAMIN D, CHOLECALCIFEROL, PO Take 5,000 Units by mouth.   No current facility-administered medications on file prior to visit.    ROS: all negative except above.   Physical Exam:  There were no vitals taken for this visit.  General Appearance: Well nourished, in no apparent distress. Eyes: PERRLA, EOMs, conjunctiva no swelling or erythema Sinuses: No Frontal/maxillary tenderness ENT/Mouth: Ext aud canals clear, TMs without erythema, bulging. No erythema, swelling, or exudate on  post pharynx.  Tonsils not swollen or erythematous. Hearing normal.  Neck: Supple, thyroid normal.  Respiratory: Respiratory effort normal, BS equal bilaterally without rales, rhonchi, wheezing or stridor.  Cardio: RRR with no MRGs. Brisk peripheral pulses without edema.  Abdomen: Soft, + BS.  Non tender, no guarding, rebound, hernias, masses. Lymphatics: Non tender without lymphadenopathy.  Musculoskeletal: Full ROM, 5/5 strength, normal gait.  Skin: Warm, dry without rashes, lesions, ecchymosis.  Neuro: Cranial nerves intact. Normal muscle tone, no cerebellar symptoms. Sensation intact.  Psych: Awake and oriented X 3, normal affect, Insight and Judgment appropriate.     Victoria Dick, NP 10:09 AM [facility]

## 2022-08-14 ENCOUNTER — Encounter: Payer: Self-pay | Admitting: Nurse Practitioner

## 2022-08-14 ENCOUNTER — Ambulatory Visit: Payer: No Typology Code available for payment source | Admitting: Nurse Practitioner

## 2022-08-14 VITALS — BP 92/60 | HR 71 | Temp 97.7°F | Ht 64.0 in | Wt 111.6 lb

## 2022-08-14 DIAGNOSIS — F411 Generalized anxiety disorder: Secondary | ICD-10-CM | POA: Diagnosis not present

## 2022-08-14 DIAGNOSIS — F3341 Major depressive disorder, recurrent, in partial remission: Secondary | ICD-10-CM

## 2022-08-14 DIAGNOSIS — R634 Abnormal weight loss: Secondary | ICD-10-CM | POA: Diagnosis not present

## 2022-08-14 DIAGNOSIS — G4700 Insomnia, unspecified: Secondary | ICD-10-CM | POA: Diagnosis not present

## 2022-08-14 DIAGNOSIS — Z79899 Other long term (current) drug therapy: Secondary | ICD-10-CM | POA: Diagnosis not present

## 2022-08-14 MED ORDER — SERTRALINE HCL 100 MG PO TABS: 100.0000 mg | ORAL_TABLET | Freq: Every day | ORAL | 2 refills | Status: DC

## 2022-08-14 NOTE — Patient Instructions (Signed)
Protein-Energy Malnutrition Protein-energy malnutrition is when a person does not eat enough protein, fat, and calories. When this happens over time, it can lead to severe loss of muscle tissue (muscle wasting). This condition also affects the body's defense system (immune system) and can lead to other health problems. What are the causes? This condition may be caused by: Not eating enough protein, fat, or calories. Having certain chronic medical conditions. Eating too little. What increases the risk? The following factors may make you more likely to develop this condition: Living in poverty. Long-term hospitalization. Alcohol or drug dependency. Addiction often leads to a lifestyle in which proper diet is ignored. Dependency can also hurt the metabolism and the body's ability to absorb nutrients. Eating disorders, such as anorexia nervosa or bulimia. Chewing or swallowing problems. People with these disorders may not eat enough. Having certain conditions, such as: Inflammatory bowel disease. Inflammation of the intestines makes it difficult for the body to absorb nutrients. Cancer or AIDS. These diseases can cause a loss of appetite. Chronic heart failure. This interferes with how the body uses nutrients. Cystic fibrosis. This disease can make it difficult for the body to absorb nutrients. Eating a diet that extremely restricts protein, fat, or calorie intake. What are the signs or symptoms? Symptoms of this condition include: Tiredness (fatigue). Weakness. Dizziness. Fainting. Weight loss. Loss of muscle tone and muscle mass. Poor immune response. Lack of menstruation. Poor memory. Hair loss. Skin changes. How is this diagnosed? This condition may be diagnosed based on: Your medical and dietary history. A physical exam. This may include a measurement of your body mass index. Blood tests. How is this treated? This condition may be managed with: Nutrition therapy. This may  include working with a dietitian. Treatment for underlying conditions. People with severe protein-energy malnutrition may need to be treated in a hospital. This may involve receiving nutrition and fluids through an IV. Follow these instructions at home:  Eat a balanced diet. In each meal, include at least one food that is high in protein. Foods that are high in protein include: Meat. Poultry. Fish. Eggs. Cheese. Milk. Beans. Nuts. Eat nutrient-rich foods that are easy to swallow and digest, such as: Fruit and yogurt smoothies. Oatmeal with nut butter. Nutrition supplement drinks. Try to eat six small meals each day instead of three large meals. Take vitamin and protein supplements as told by your health care provider or dietitian. Follow your health care provider's recommendations about exercise and activity. Keep all follow-up visits. This is important. Contact a health care provider if: You have increased weakness or fatigue. You faint. You are a woman and you stop having your period (menstruating). You have rapid hair loss. You have unexpected weight loss. You have diarrhea. You have nausea and vomiting. Get help right away if: You have difficulty breathing. You have chest pain. These symptoms may represent a serious problem that is an emergency. Do not wait to see if the symptoms will go away. Get medical help right away. Call your local emergency services (911 in the [place] Do not drive yourself to the hospital. Summary Protein-energy malnutrition is when a person does not eat enough protein, fat, and calories. Protein-energy malnutrition can lead to severe loss of muscle tissue (muscle wasting). This condition also affects the body's defense system (immune system) and can lead to other health problems. Talk with your health care provider about treatment for this condition. Effective treatment depends on the underlying cause of the malnutrition. This information is not    intended to replace advice given to you by your health care provider. Make sure you discuss any questions you have with your health care provider. Document Revised: 01/22/2020 Document Reviewed: 01/22/2020 Elsevier Patient Education  2024 Elsevier Inc.  

## 2022-08-15 LAB — THYROID PANEL WITH TSH
Free Thyroxine Index: 2 (ref 1.4–3.8)
T3 Uptake: 32 % (ref 22–35)
T4, Total: 6.1 ug/dL (ref 5.1–11.9)
TSH: 1.6 mIU/L

## 2022-11-04 ENCOUNTER — Other Ambulatory Visit: Payer: Self-pay

## 2022-11-04 ENCOUNTER — Emergency Department (HOSPITAL_BASED_OUTPATIENT_CLINIC_OR_DEPARTMENT_OTHER)
Admission: EM | Admit: 2022-11-04 | Discharge: 2022-11-04 | Disposition: A | Discharge status: Home / Self Care | Payer: No Typology Code available for payment source | Attending: Emergency Medicine | Admitting: Emergency Medicine

## 2022-11-04 ENCOUNTER — Emergency Department (HOSPITAL_BASED_OUTPATIENT_CLINIC_OR_DEPARTMENT_OTHER): Payer: No Typology Code available for payment source

## 2022-11-04 ENCOUNTER — Encounter (HOSPITAL_BASED_OUTPATIENT_CLINIC_OR_DEPARTMENT_OTHER): Payer: Self-pay | Admitting: Emergency Medicine

## 2022-11-04 DIAGNOSIS — R079 Chest pain, unspecified: Secondary | ICD-10-CM | POA: Diagnosis present

## 2022-11-04 LAB — CBC
HCT: 43.5 % (ref 36.0–46.0)
Hemoglobin: 14.8 g/dL (ref 12.0–15.0)
MCH: 29.5 pg (ref 26.0–34.0)
MCHC: 34 g/dL (ref 30.0–36.0)
MCV: 86.7 fL (ref 80.0–100.0)
Platelets: 259 10*3/uL (ref 150–400)
RBC: 5.02 MIL/uL (ref 3.87–5.11)
RDW: 13.3 % (ref 11.5–15.5)
WBC: 8.7 10*3/uL (ref 4.0–10.5)
nRBC: 0 % (ref 0.0–0.2)

## 2022-11-04 LAB — BASIC METABOLIC PANEL
Anion gap: 10 (ref 5–15)
BUN: 14 mg/dL (ref 6–20)
CO2: 25 mmol/L (ref 22–32)
Calcium: 9.5 mg/dL (ref 8.9–10.3)
Chloride: 104 mmol/L (ref 98–111)
Creatinine, Ser: 0.84 mg/dL (ref 0.44–1.00)
GFR, Estimated: >60 mL/min (ref >60)
Glucose, Bld: 88 mg/dL (ref 70–99)
Potassium: 3.3 mmol/L — ABNORMAL LOW (ref 3.5–5.1)
Sodium: 139 mmol/L (ref 135–145)

## 2022-11-04 LAB — TROPONIN I (HIGH SENSITIVITY): Troponin I (High Sensitivity): <2 ng/L (ref <18)

## 2022-11-04 MED ORDER — KETOROLAC TROMETHAMINE 15 MG/ML IJ SOLN
15.0000 mg | Freq: Once | INTRAMUSCULAR | Status: AC
  Administered 2022-11-04: 15 mg via INTRAVENOUS
  Filled 2022-11-04: qty 1

## 2022-11-04 MED ORDER — ACETAMINOPHEN 500 MG PO TABS
1000.0000 mg | ORAL_TABLET | Freq: Once | ORAL | Status: AC
  Administered 2022-11-04: 1000 mg via ORAL
  Filled 2022-11-04: qty 2

## 2022-11-04 NOTE — Discharge Instructions (Signed)
You can take Tylenol and ibuprofen at home.  This should help out with your symptoms.  This should begin to improve over the next few days.  Come back to the emergency department if develop new or worsening pain in your chest or difficulty breathing.

## 2022-11-04 NOTE — ED Triage Notes (Signed)
Pt c/o RT side CP and back pain since yesterday, worsens with movement

## 2022-11-04 NOTE — ED Provider Notes (Signed)
[facility] AT [facility] Provider Note   CSN: [id number] Arrival date & time: 11/04/22  1029     History {Add pertinent medical, surgical, social history, OB history to HPI:1} Chief Complaint  Patient presents with   Chest Pain    Victoria Ashley is a 47 y.o. female.  This is a 47 year old female who is here today for right sided chest pain with some radiation to her back.  She says that she was seen in urgent care 2 days ago, and she was having a period where she was having difficulty with breathing.  Patient does have a smoking history.  She says that she got a breathing treatment, and some steroids and felt better with her breathing.  She says that in the ensuing days, she has had some pain on the right side of her chest, that is worse when she moves.  No difficulty with her breathing.  No history of blood clots, no recent travel.   Chest Pain      Home Medications Prior to Admission medications   Medication Sig Start Date End Date Taking? Authorizing Provider  alendronate (FOSAMAX) 70 MG tablet TAKE 1 TABLET BY MOUTH EVERY 7 (SEVEN) DAYS. TAKE WITH A FULL GLASS OF WATER ON AN EMPTY STOMACH. Patient not taking: Reported on 04/16/2022    [provider]  baclofen (LIORESAL) 10 MG tablet TAKE 1 TABLET BY MOUTH THREE TIMES A DAY 04/22/22   Kathryne Hitch, MD  estradiol-norethindrone (ACTIVELLA) 1-0.5 MG tablet Take 1 tablet by mouth daily. 08/03/22   [provider]  QUEtiapine (SEROQUEL) 25 MG tablet TAKE 1/2 - 1 TABLET (12.5-25 MG TOTAL) BY MOUTH AT BEDTIME. 08/12/22   Raynelle Dick, NP  sertraline (ZOLOFT) 100 MG tablet Take 1 tablet (100 mg total) by mouth daily. Take 1 and 1/2 tabs daily 08/14/22   Raynelle Dick, NP  traZODone (DESYREL) 50 MG tablet TAKE 1-1.5 TABS 1 HOUR PRIOR TO BEDTIME AS NEEDED FOR SLEEP. 04/08/22   Raynelle Dick, NP  VITAMIN D, CHOLECALCIFEROL, PO Take 5,000 Units by mouth.    [provider]      Allergies    Patient has no known allergies.    Review of Systems   Review of Systems  Cardiovascular:  Positive for chest pain.    Physical Exam Updated Vital Signs BP (!) 142/92   Pulse 86   Temp 98.4 F (36.9 C) (Oral)   Resp 16   Wt 51.3 kg   SpO2 100%   BMI 19.40 kg/m  Physical Exam HENT:     Head: Normocephalic.  Cardiovascular:     Rate and Rhythm: Normal rate.     Heart sounds: Normal heart sounds.     No friction rub.  Musculoskeletal:        General: Normal range of motion.     Comments: Specific point tenderness over the right third and fourth rib.  This is reproducible.  Neurological:     Mental Status: She is alert.     ED Results / Procedures / Treatments   Labs (all labs ordered are listed, but only abnormal results are displayed) Labs Reviewed - No data to display  EKG None  Radiology No results found.  Procedures Ultrasound ED Echo  Date/Time: 11/04/2022 11:36 AM  Performed by: Arletha Pili, DO Authorized by: Arletha Pili, DO   Procedure details:    Indications: chest pain     Views: subxiphoid  and IVC view     Images: archived   Findings:    Pericardium: no pericardial effusion     LV Function: normal (>50% EF)     RV Diameter: normal     IVC: normal   Impression:    Impression: normal     {Document cardiac monitor, telemetry assessment procedure when appropriate:1}  Medications Ordered in ED Medications - No data to display  ED Course/ Medical Decision Making/ A&P   {   Click here for ABCD2, HEART and other calculatorsREFRESH Note before signing :1}                              Medical Decision Making 47 year old female here today with chest wall pain.  Differential diagnoses include costochondritis, less likely ACS, less likely dissection, less likely PE.  My independent review of the patient's EKG shows normal sinus rhythm, no ST 7 depressions or elevation, no T wave inversions, no  evidence of acute ischemia.  Patient with no significant cardiac risk factors.  On exam, she is exquisitely tender over her right third and fourth ribs.  Sounds though she may have had some respiratory distress secondary to her smoking history.  She is not any respiratory distress today.  Patient quite tearful when describing her symptoms, but was very relieved when I explained to her how I thought her exam was quite reassuring.  I discussed pros and cons of additional imaging for other intrathoracic processes, and patient felt comfortable with not pursuing those.  Will check a troponin, basic labs and a chest x-ray on the patient.  Reassessment-11:35 AM.  Patient with a high sensitive troponin less than 2.  Potassium a little bit low at 3.3.  Will discharge patient home.  Return precautions discussed at bedside.  Amount and/or Complexity of Data Reviewed Labs: ordered. Radiology: ordered.  Risk OTC drugs. Prescription drug management.     {Document critical care time when appropriate:1} {Document review of labs and clinical decision tools ie heart score, Chads2Vasc2 etc:1}  {Document your independent review of radiology images, and any outside records:1} {Document your discussion with family members, caretakers, and with consultants:1} {Document social determinants of health affecting pt's care:1} {Document your decision making why or why not admission, treatments were needed:1} Final Clinical Impression(s) / ED Diagnoses Final diagnoses:  None    Rx / DC Orders ED Discharge Orders     None

## 2022-11-06 ENCOUNTER — Other Ambulatory Visit: Payer: Self-pay | Admitting: Nurse Practitioner

## 2022-11-06 DIAGNOSIS — F3341 Major depressive disorder, recurrent, in partial remission: Secondary | ICD-10-CM

## 2022-11-06 DIAGNOSIS — G4700 Insomnia, unspecified: Secondary | ICD-10-CM

## 2022-12-18 ENCOUNTER — Encounter: Payer: No Typology Code available for payment source | Admitting: Nurse Practitioner

## 2023-01-08 ENCOUNTER — Other Ambulatory Visit: Payer: Self-pay | Admitting: Nurse Practitioner

## 2023-01-08 DIAGNOSIS — G4700 Insomnia, unspecified: Secondary | ICD-10-CM

## 2023-02-09 ENCOUNTER — Other Ambulatory Visit: Payer: Self-pay | Admitting: Nurse Practitioner

## 2023-02-09 DIAGNOSIS — F3341 Major depressive disorder, recurrent, in partial remission: Secondary | ICD-10-CM

## 2023-02-09 DIAGNOSIS — G4700 Insomnia, unspecified: Secondary | ICD-10-CM

## 2023-02-27 ENCOUNTER — Ambulatory Visit: Payer: No Typology Code available for payment source | Admitting: Nurse Practitioner

## 2023-03-03 ENCOUNTER — Ambulatory Visit: Payer: No Typology Code available for payment source | Admitting: Nurse Practitioner

## 2023-03-04 ENCOUNTER — Ambulatory Visit: Payer: No Typology Code available for payment source | Admitting: Nurse Practitioner

## 2023-03-04 VITALS — BP 110/70 | HR 72 | Temp 97.9°F | Resp 17 | Ht 64.0 in | Wt 114.0 lb

## 2023-03-04 DIAGNOSIS — G8929 Other chronic pain: Secondary | ICD-10-CM

## 2023-03-04 DIAGNOSIS — M5442 Lumbago with sciatica, left side: Secondary | ICD-10-CM | POA: Diagnosis not present

## 2023-03-04 DIAGNOSIS — M8580 Other specified disorders of bone density and structure, unspecified site: Secondary | ICD-10-CM

## 2023-03-04 NOTE — Progress Notes (Signed)
Assessment and Plan:  Victoria Ashley was seen today for an episodic visit.  1. 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  - Ambulatory referral to Rheumatology  2. Osteopenia, unspecified location Discussed benefit of possible RANK ligand inhibitor. Refer to Rheumatology for further review and evaluation.  - Ambulatory referral to Rheumatology   Notify office for further evaluation and treatment, questions or concerns if s/s fail to improve. The risks and benefits of my recommendations, as well as other treatment options were discussed with the patient today. Questions were answered.  Further disposition pending results of labs. Discussed med's effects and SE's.    Over 20 minutes of exam, counseling, chart review, and critical decision making was performed.   Future Appointments  Date Time Provider Department Center  04/16/2023 10:00 AM Raynelle Dick, NP [facility] None    ------------------------------------------------------------------------------------------------------------------   HPI BP 110/70   Pulse 72   Temp 97.9 F (36.6 C)   Resp 17   Ht 5\' 4"  (1.626 m)   Wt 114 lb (51.7 kg)   SpO2 98%   BMI 19.57 kg/m   48 y.o.female presents for evaluation of left sided back pain with the presence of osteopenia.  She reports following Orthopedics for treatment of left sided sciatica pain and states that a recent follow up with imaging reveled worsening bone loss.  Upon review of DEXA dated 04/2020 confirmed low bone mass/osteopenia.  She was requested to start treatment with a possible injectable such as Prolia.  She aims to have an updated DEXA scan.   Past Medical History:  Diagnosis Date   BMI less than 19,adult 04/17/2020   Low body weight due to inadequate caloric intake    Mild concussion 2022     No Known  Allergies  Current Outpatient Medications on File Prior to Visit  Medication Sig   baclofen (LIORESAL) 10 MG tablet TAKE 1 TABLET BY MOUTH THREE TIMES A DAY   estradiol-norethindrone (ACTIVELLA) 1-0.5 MG tablet Take 1 tablet by mouth daily.   QUEtiapine (SEROQUEL) 25 MG tablet TAKE 1/2 - 1 TABLET (12.5-25 MG TOTAL) BY MOUTH AT BEDTIME.   sertraline (ZOLOFT) 100 MG tablet Take 1 tablet (100 mg total) by mouth daily. Take 1 and 1/2 tabs daily   traZODone (DESYREL) 50 MG tablet TAKE 1-1.5 TABS 1 HOUR PRIOR TO BEDTIME AS NEEDED FOR SLEEP.   VITAMIN D, CHOLECALCIFEROL, PO Take 5,000 Units by mouth.   alendronate (FOSAMAX) 70 MG tablet TAKE 1 TABLET BY MOUTH EVERY 7 (SEVEN) DAYS. TAKE WITH A FULL GLASS OF WATER ON AN EMPTY STOMACH. (Patient not taking: Reported on 03/04/2023)   No current facility-administered medications on file prior to visit.    ROS: all negative except what is noted in the HPI.   Physical Exam:  BP 110/70   Pulse 72   Temp 97.9 F (36.6 C)   Resp 17   Ht 5\' 4"  (1.626 m)   Wt 114 lb (51.7 kg)   SpO2 98%   BMI 19.57 kg/m   General Appearance: NAD.  Awake, conversant and cooperative. Eyes: PERRLA, EOMs intact.  Sclera white.  Conjunctiva without erythema. Sinuses: No frontal/maxillary tenderness.  No nasal discharge. Nares patent.  ENT/Mouth: Ext aud canals clear.  Bilateral TMs w/DOL and without erythema or bulging. Hearing intact.  Posterior pharynx without swelling or exudate.  Tonsils without swelling or erythema.  Neck: Supple.  No masses, nodules or thyromegaly. Respiratory: Effort is regular with non-labored breathing. Breath sounds are equal bilaterally without rales, rhonchi, wheezing or stridor.  Cardio: RRR with no MRGs. Brisk peripheral pulses without edema.  Abdomen: Active BS in all four quadrants.  Soft and non-tender without guarding, rebound tenderness, hernias or masses. Lymphatics: Non tender without lymphadenopathy.  Musculoskeletal: Full ROM, 5/5  strength, normal ambulation.  No clubbing or cyanosis. Skin: Appropriate color for ethnicity. Warm without rashes, lesions, ecchymosis, ulcers.  Neuro: CN II-XII grossly normal. Normal muscle tone without cerebellar symptoms and intact sensation.   Psych: AO X 3,  appropriate mood and affect, insight and judgment.     Adela Glimpse, NP 3:26 PM [facility]

## 2023-03-09 ENCOUNTER — Encounter: Payer: Self-pay | Admitting: Nurse Practitioner

## 2023-03-10 ENCOUNTER — Encounter: Payer: Self-pay | Admitting: Nurse Practitioner

## 2023-03-10 ENCOUNTER — Telehealth: Payer: Self-pay

## 2023-03-10 NOTE — Patient Instructions (Signed)
 Denosumab Injection (Osteoporosis) What is this medication? DENOSUMAB (den oh SUE mab) prevents and treats osteoporosis. It works by Interior and spatial designer stronger and less likely to break (fracture). It is a monoclonal antibody. This medicine may be used for other purposes; ask your health care provider or pharmacist if you have questions. COMMON BRAND NAME(S): Prolia What should I tell my care team before I take this medication? They need to know if you have any of these conditions: Dental or gum disease Had thyroid or parathyroid (glands located in neck) surgery Having dental surgery or a tooth pulled Kidney disease Low levels of calcium in the blood On dialysis Poor nutrition Thyroid disease Trouble absorbing nutrients from your food An unusual or allergic reaction to denosumab, other medications, foods, dyes, or preservatives Pregnant or trying to get pregnant Breastfeeding How should I use this medication? This medication is injected under the skin. It is given by your care team in a hospital or clinic setting. A special MedGuide will be given to you before each treatment. Be sure to read this information carefully each time. Talk to your care team about the use of this medication in children. Special care may be needed. Overdosage: If you think you have taken too much of this medicine contact a poison control center or emergency room at once. NOTE: This medicine is only for you. Do not share this medicine with others. What if I miss a dose? Keep appointments for follow-up doses. It is important not to miss your dose. Call your care team if you are unable to keep an appointment. What may interact with this medication? Do not take this medication with any of the following: Other medications that contain denosumab This medication may also interact with the following: Medications that lower your chance of fighting infection Steroid medications, such as prednisone or cortisone This  list may not describe all possible interactions. Give your health care provider a list of all the medicines, herbs, non-prescription drugs, or dietary supplements you use. Also tell them if you smoke, drink alcohol, or use illegal drugs. Some items may interact with your medicine. What should I watch for while using this medication? Your condition will be monitored carefully while you are receiving this medication. You may need blood work done while taking this medication. This medication may increase your risk of getting an infection. Call your care team for advice if you get a fever, chills, sore throat, or other symptoms of a cold or flu. Do not treat yourself. Try to avoid being around people who are sick. Tell your dentist and dental surgeon that you are taking this medication. You should not have major dental surgery while on this medication. See your dentist to have a dental exam and fix any dental problems before starting this medication. Take good care of your teeth while on this medication. Make sure you see your dentist for regular follow-up appointments. This medication may cause low levels of calcium in your body. The risk of severe side effects is increased in people with kidney disease. Your care team may prescribe calcium and vitamin D to help prevent low calcium levels while you take this medication. It is important to take calcium and vitamin D as directed by your care team. Talk to your care team if you may be pregnant. Serious birth defects may occur if you take this medication during pregnancy and for 5 months after the last dose. You will need a negative pregnancy test before starting this medication. Contraception  is recommended while taking this medication and for 5 months after the last dose. Your care team can help you find the option that works for you. Talk to your care team before breastfeeding. Changes to your treatment plan may be needed. What side effects may I notice from  receiving this medication? Side effects that you should report to your care team as soon as possible: Allergic reactions--skin rash, itching, hives, swelling of the face, lips, tongue, or throat Infection--fever, chills, cough, sore throat, wounds that don't heal, pain or trouble when passing urine, general feeling of discomfort or being unwell Low calcium level--muscle pain or cramps, confusion, tingling, or numbness in the hands or feet Osteonecrosis of the jaw--pain, swelling, or redness in the mouth, numbness of the jaw, poor healing after dental work, unusual discharge from the mouth, visible bones in the mouth Severe bone, joint, or muscle pain Skin infection--skin redness, swelling, warmth, or pain Side effects that usually do not require medical attention (report these to your care team if they continue or are bothersome): Back pain Headache Joint pain Muscle pain Pain in the hands, arms, legs, or feet Runny or stuffy nose Sore throat This list may not describe all possible side effects. Call your doctor for medical advice about side effects. You may report side effects to FDA at 1-800-FDA-1088. Where should I keep my medication? This medication is given in a hospital or clinic. It will not be stored at home. NOTE: This sheet is a summary. It may not cover all possible information. If you have questions about this medicine, talk to your doctor, pharmacist, or health care provider.  2024 [employer] Standard (2022-02-26 00:00:00)

## 2023-03-10 NOTE — Telephone Encounter (Signed)
Urgent referral faxed to [facility].

## 2023-03-13 ENCOUNTER — Encounter (HOSPITAL_COMMUNITY): Payer: Self-pay | Admitting: Emergency Medicine

## 2023-03-13 ENCOUNTER — Emergency Department (HOSPITAL_COMMUNITY): Payer: No Typology Code available for payment source

## 2023-03-13 ENCOUNTER — Emergency Department (HOSPITAL_COMMUNITY)
Admission: EM | Admit: 2023-03-13 | Discharge: 2023-03-13 | Disposition: A | Discharge status: Home / Self Care | Payer: No Typology Code available for payment source | Attending: Emergency Medicine | Admitting: Emergency Medicine

## 2023-03-13 DIAGNOSIS — M25511 Pain in right shoulder: Secondary | ICD-10-CM | POA: Diagnosis not present

## 2023-03-13 DIAGNOSIS — J449 Chronic obstructive pulmonary disease, unspecified: Secondary | ICD-10-CM | POA: Insufficient documentation

## 2023-03-13 DIAGNOSIS — M549 Dorsalgia, unspecified: Secondary | ICD-10-CM | POA: Insufficient documentation

## 2023-03-13 DIAGNOSIS — R079 Chest pain, unspecified: Secondary | ICD-10-CM | POA: Insufficient documentation

## 2023-03-13 LAB — CBC
HCT: 39.2 % (ref 36.0–46.0)
Hemoglobin: 13 g/dL (ref 12.0–15.0)
MCH: 30 pg (ref 26.0–34.0)
MCHC: 33.2 g/dL (ref 30.0–36.0)
MCV: 90.5 fL (ref 80.0–100.0)
Platelets: 210 10*3/uL (ref 150–400)
RBC: 4.33 MIL/uL (ref 3.87–5.11)
RDW: 12.9 % (ref 11.5–15.5)
WBC: 8.2 10*3/uL (ref 4.0–10.5)
nRBC: 0 % (ref 0.0–0.2)

## 2023-03-13 LAB — BASIC METABOLIC PANEL
Anion gap: 11 (ref 5–15)
BUN: 11 mg/dL (ref 6–20)
CO2: 22 mmol/L (ref 22–32)
Calcium: 8.9 mg/dL (ref 8.9–10.3)
Chloride: 102 mmol/L (ref 98–111)
Creatinine, Ser: 0.73 mg/dL (ref 0.44–1.00)
GFR, Estimated: >60 mL/min (ref >60)
Glucose, Bld: 161 mg/dL — ABNORMAL HIGH (ref 70–99)
Potassium: 3.2 mmol/L — ABNORMAL LOW (ref 3.5–5.1)
Sodium: 135 mmol/L (ref 135–145)

## 2023-03-13 LAB — TROPONIN I (HIGH SENSITIVITY): Troponin I (High Sensitivity): <2 ng/L (ref <18)

## 2023-03-13 LAB — HCG, SERUM, QUALITATIVE: Preg, Serum: NEGATIVE

## 2023-03-13 MED ORDER — OXYCODONE HCL 5 MG PO TABS
5.0000 mg | ORAL_TABLET | ORAL | 0 refills | Status: DC | PRN
Start: 2023-03-13 — End: 2023-05-21

## 2023-03-13 MED ORDER — OXYCODONE HCL 5 MG PO TABS
10.0000 mg | ORAL_TABLET | Freq: Once | ORAL | Status: AC
  Administered 2023-03-13: 10 mg via ORAL
  Filled 2023-03-13: qty 2

## 2023-03-13 MED ORDER — KETOROLAC TROMETHAMINE 30 MG/ML IJ SOLN
30.0000 mg | Freq: Once | INTRAMUSCULAR | Status: AC
  Administered 2023-03-13: 30 mg via INTRAVENOUS
  Filled 2023-03-13: qty 1

## 2023-03-13 MED ORDER — BACLOFEN 10 MG PO TABS: 10.0000 mg | ORAL_TABLET | Freq: Three times a day (TID) | ORAL | 0 refills | Status: AC

## 2023-03-13 NOTE — Discharge Instructions (Signed)
 This pain is most likely related to muscular strain Given severity will send in short course of oxycodone  and muscle relaxer Please follow up with your orthopedic doctor

## 2023-03-13 NOTE — ED Provider Notes (Signed)
 [place] EMERGENCY DEPARTMENT AT [facility] Provider Note   CSN: [id number] Arrival date & time: 03/13/23  1434     History  Chief Complaint  Patient presents with   Chest Pain    Victoria Ashley is a 48 y.o. female PMH COPD, osteoporosis, MDD, DJD awaiting fusion.   Chest Pain Associated symptoms: no abdominal pain, no back pain, no cough, no fever, no palpitations, no shortness of breath and no vomiting    States that since Tuesday of this week has been having worsening right shoulder blade and shoulder pain.  Intermittently had some that wrapped around to the anterior part of her right chest.  Says that it is hard to take deep breaths due to the pain.  States that movement makes it worse.  Has taken 1 ibuprofen  which helped somewhat but did not relieve the pain on the way.  She also has some chronic back pain and she is awaiting lumbar fusion as well.  She denies any trauma to the area.  Denies any weakness in her arms.  Denies any shooting pains down her arm.  No numbness and tingling.     Home Medications Prior to Admission medications   Medication Sig Start Date End Date Taking? Authorizing Provider  oxyCODONE  (ROXICODONE ) 5 MG immediate release tablet Take 1 tablet (5 mg total) by mouth every 4 (four) hours as needed for severe pain (pain score 7-10). 03/13/23  Yes Christia Budds, MD  alendronate  (FOSAMAX ) 70 MG tablet TAKE 1 TABLET BY MOUTH EVERY 7 (SEVEN) DAYS. TAKE WITH A FULL GLASS OF WATER ON AN EMPTY STOMACH. Patient not taking: Reported on 03/04/2023    [provider]  baclofen  (LIORESAL ) 10 MG tablet Take 1 tablet (10 mg total) by mouth 3 (three) times daily. 03/13/23   Christia Budds, MD  estradiol-norethindrone (ACTIVELLA) 1-0.5 MG tablet Take 1 tablet by mouth daily. 08/03/22   [provider]  QUEtiapine  (SEROQUEL ) 25 MG tablet TAKE 1/2 - 1 TABLET (12.5-25 MG TOTAL) BY MOUTH AT BEDTIME. 02/09/23   Wilkinson, Dana E, NP  sertraline   (ZOLOFT ) 100 MG tablet Take 1 tablet (100 mg total) by mouth daily. Take 1 and 1/2 tabs daily 08/14/22   Wilkinson, Dana E, NP  traZODone  (DESYREL ) 50 MG tablet TAKE 1-1.5 TABS 1 HOUR PRIOR TO BEDTIME AS NEEDED FOR SLEEP. 01/08/23   Wilkinson, Dana E, NP  VITAMIN D , CHOLECALCIFEROL, PO Take 5,000 Units by mouth.    [provider]      Allergies    Patient has no known allergies.    Review of Systems   Review of Systems  Constitutional:  Negative for chills and fever.  HENT:  Negative for ear pain and sore throat.   Eyes:  Negative for pain and visual disturbance.  Respiratory:  Negative for cough and shortness of breath.   Cardiovascular:  Positive for chest pain. Negative for palpitations.  Gastrointestinal:  Negative for abdominal pain and vomiting.  Genitourinary:  Negative for dysuria and hematuria.  Musculoskeletal:  Positive for myalgias. Negative for arthralgias and back pain.  Skin:  Negative for color change and rash.  Neurological:  Negative for seizures and syncope.  All other systems reviewed and are negative.   Physical Exam Updated Vital Signs BP 119/80 (BP Location: Left Arm)   Pulse 66   Temp 97.9 F (36.6 C) (Oral)   Resp 16   Ht 5' 4 (1.626 m)   Wt 51.7 kg   SpO2 98%  BMI 19.57 kg/m  Physical Exam Vitals and nursing note reviewed.  Constitutional:      General: She is not in acute distress.    Appearance: She is well-developed.  HENT:     Head: Normocephalic and atraumatic.  Eyes:     Conjunctiva/sclera: Conjunctivae normal.  Cardiovascular:     Rate and Rhythm: Normal rate and regular rhythm.     Heart sounds: No murmur heard. Pulmonary:     Effort: Pulmonary effort is normal. No respiratory distress.     Breath sounds: Normal breath sounds.  Chest:     Chest wall: Tenderness present.     Comments: Tenderness to palpation along right scapula to light touch, some right sided ribs, nontender to palpation along right  shoulder/clavicle. Abdominal:     Palpations: Abdomen is soft.     Tenderness: There is no abdominal tenderness.  Musculoskeletal:        General: No swelling.     Cervical back: Neck supple.     Comments: Pulses intact distally in upper extremity, well-perfused with strength 5 out of 5 bilaterally with grip strength.  Sensation intact.  Limited range of motion of right shoulder due to severe pain.  Skin:    General: Skin is warm and dry.     Capillary Refill: Capillary refill takes less than 2 seconds.     Findings: No rash.  Neurological:     Mental Status: She is alert.  Psychiatric:        Mood and Affect: Mood normal.     ED Results / Procedures / Treatments   Labs (all labs ordered are listed, but only abnormal results are displayed) Labs Reviewed  BASIC METABOLIC PANEL - Abnormal; Notable for the following components:      Result Value   Potassium 3.2 (*)    Glucose, Bld 161 (*)    All other components within normal limits  CBC  HCG, SERUM, QUALITATIVE  TROPONIN I (HIGH SENSITIVITY)  TROPONIN I (HIGH SENSITIVITY)    EKG EKG Interpretation Date/Time:  Thursday March 13 2023 14:46:11 EST Ventricular Rate:  62 PR Interval:  168 QRS Duration:  107 QT Interval:  420 QTC Calculation: 427 R Axis:   86  Text Interpretation: Sinus rhythm RSR' in V1 or V2, right VCD or RVH Confirmed by Jerral Meth [phone]) on 03/13/2023 10:50:53 PM  Radiology DG Chest 2 View Result Date: 03/13/2023 CLINICAL DATA:  Chest pain EXAM: CHEST - 2 VIEW COMPARISON:  Chest x-ray 11/04/2022 FINDINGS: The heart size and mediastinal contours are within normal limits. Both lungs are clear. The visualized skeletal structures are unremarkable. IMPRESSION: No active cardiopulmonary disease. Electronically Signed   By: Greig Pique M.D.   On: 03/13/2023 15:53    Procedures Procedures   Medications Ordered in ED Medications  ketorolac  (TORADOL ) 30 MG/ML injection 30 mg (has no administration  in time range)  oxyCODONE  (Oxy IR/ROXICODONE ) immediate release tablet 10 mg (10 mg Oral Given 03/13/23 2305)    ED Course/ Medical Decision Making/ A&P Clinical Course as of 03/13/23 2313  Thu Mar 13, 2023  2304 Evaluated primarily at bedside.  No peripheral left-sided shoulder pain. No acute distress.  She has a history of severe osteoporosis, rheumatoid arthritis following in outpatient setting.  Does not typically have severe pain however fluid that came over the past 24 hours has been intractable.  Multiple medications at home without resolution. Very atypical, negative troponin, low heart score, benign nodule, No displaced rib fractures on  x-ray KG is reassuring.  Stable for outpatient management with supportive care. [CC]    Clinical Course User Index [CC] Jerral Meth, MD   {            HEART Score: 1                    Medical Decision Making Amount and/or Complexity of Data Reviewed Labs: ordered. Radiology: ordered.   Medical Decision Making:   Victoria Ashley is a 48 y.o. female who presented to the ED today with right sided chest pain, right scapular pain, right shoulder pain detailed above.    Additional history discussed with patient's family/caregivers.  Patient's presentation is complicated by their history of DJD. osteoporosis.  Complete initial physical exam performed, notably the patient  was extremely tender to palpation right scapula palpation, limited motion due to pain.    Reviewed and confirmed nursing documentation for past medical history, family history, social history.    Initial Assessment:   With the patient's presentation of right shoulder blade pain and atypical chest pain, most likely diagnosis is musculoskeletal given tenderness to palpation. Other diagnoses were considered including (but not limited to) MI (troponin negative and EKG within normal limits) fracture (no trauma to this area, no step-offs), shingles(no rashes or lesions on this area).  These are considered less likely due to history of present illness and physical exam findings.    Initial Plan:  hCG Screening labs including CBC and Metabolic panel to evaluate for infectious or metabolic etiology of disease.  CXR to evaluate for structural/infectious intrathoracic pathology.  EKG and troponin to evaluate for cardiac pathology Objective evaluation as below reviewed   Initial Study Results:   Laboratory  All laboratory results reviewed without evidence of clinically relevant pathology.   Exceptions include: K mildly low at 3.2, glucose 161   EKG EKG was reviewed independently. Rate, rhythm, axis, intervals all examined and without medically relevant abnormality. ST segments without concerns for elevations.    Radiology:  All images reviewed independently. Agree with radiology report at this time.   DG Chest 2 View Result Date: 03/13/2023 CLINICAL DATA:  Chest pain EXAM: CHEST - 2 VIEW COMPARISON:  Chest x-ray 11/04/2022 FINDINGS: The heart size and mediastinal contours are within normal limits. Both lungs are clear. The visualized skeletal structures are unremarkable. IMPRESSION: No active cardiopulmonary disease. Electronically Signed   By: Greig Pique M.D.   On: 03/13/2023 15:53    Final Assessment and Plan:   48 year old here for right-sided chest pain and right scapular pain with extreme tenderness.  Negative cardiac workup in ED, highly unlikely to be cardiac in etiology.  Patient given Toradol  and narcotic in ED with improvement in pain.  Patient sent with short course of narcotics and muscle relaxer to help with this spasm/pain.  Patient instructed to follow-up with orthopedic physician/PCP.   Clinical Impression:  1. Chest pain, unspecified type      Discharge    Final Clinical Impression(s) / ED Diagnoses Final diagnoses:  Chest pain, unspecified type    Rx / DC Orders ED Discharge Orders          Ordered    oxyCODONE  (ROXICODONE ) 5 MG immediate  release tablet  Every 4 hours PRN        03/13/23 2303    baclofen  (LIORESAL ) 10 MG tablet  3 times daily        03/13/23 2303  Christia Budds, MD 03/13/23 7680    Jerral Meth, MD 03/15/23 8197

## 2023-03-13 NOTE — ED Provider Triage Note (Signed)
 Emergency Medicine Provider Triage Evaluation Note  Victoria Ashley , a 48 y.o. female  was evaluated in triage.  Pt complains of R sided chest pain radiating to R shoulder x 3 days, progressively worse. Described as sharp, stabbing. Hx osteoporosis, COPD. This is accompanied with shortness of breath worse on exertion and worse with arm movement and cough/deep inspiration. No long trips, no birth control.   Endorses chronic low back pain (awaiting surgery)  Denies trauma, injury, headache, fever, abdominal pain, n/v/d, dysuria, LE swelling.  Review of Systems  Positive: N/a Negative: N/a  Physical Exam  BP 121/87 (BP Location: Right Arm)   Pulse 63   Temp 97.8 F (36.6 C) (Oral)   Resp 18   Ht 5' 4 (1.626 m)   Wt 51.7 kg   SpO2 100%   BMI 19.57 kg/m  Gen:   Awake, no distress   Resp:  Normal effort  MSK:   Moves extremities without difficulty  Other:    Medical Decision Making  Medically screening exam initiated at 2:58 PM.  Appropriate orders placed.  ANNEBELLE Ashley was informed that the remainder of the evaluation will be completed by another provider, this initial triage assessment does not replace that evaluation, and the importance of remaining in the ED until their evaluation is complete.     Victoria Ashley, [place] 03/13/23 [phone]

## 2023-03-13 NOTE — ED Triage Notes (Signed)
 Pt here from home with c/o right sided chest pain worse with movement with some slight sob

## 2023-03-13 NOTE — ED Notes (Signed)
 ED Provider at bedside.

## 2023-03-19 IMAGING — MR MR LUMBAR SPINE W/O CM
4 of 5 series · 23 of 48 positions shown · non-contrast
Comparison: 8786

CLINICAL DATA: Left low back pain with left-sided sciatica,
unspecified chronicity

EXAM:
MRI LUMBAR SPINE WITHOUT CONTRAST
TECHNIQUE: Multiplanar, multisequence MR imaging of the lumbar spine was
performed. No intravenous contrast was administered.

[Series 5: T2 · sagittal · 4.0mm · 0.73mm/px · 6 of 15 slices shown (1 of 2)]
[im 1/15]
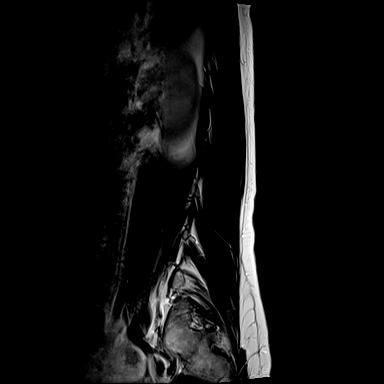
[im 3/15]
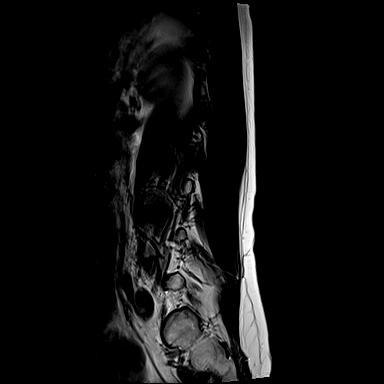
[im 6/15]
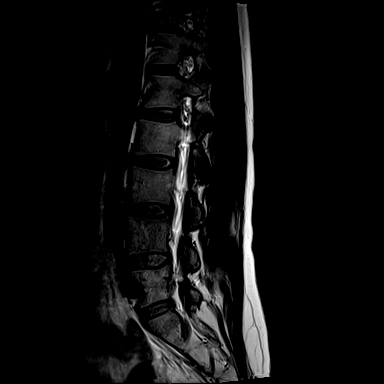
[im 9/15]
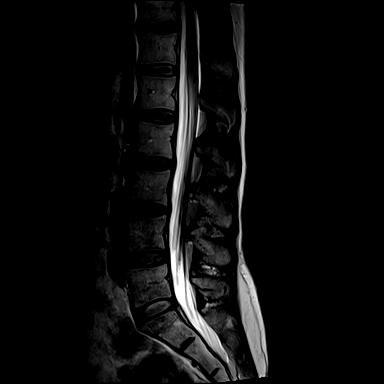
[im 12/15]
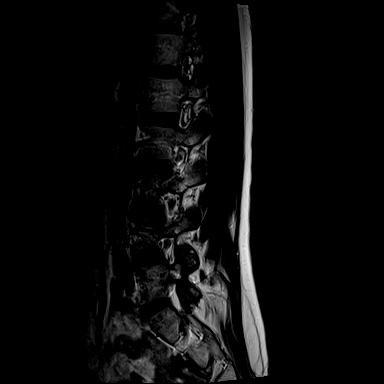
[im 15/15]
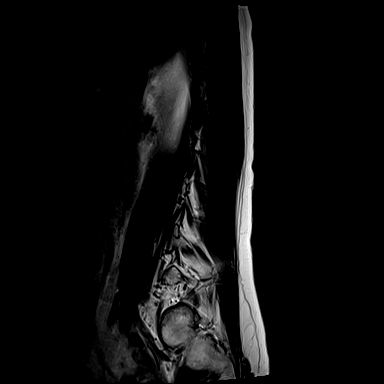

[Series 6: T1 · sagittal · 4.0mm · 0.73mm/px · 5 of 15 slices shown (1 of 2)]
[im 1/15]
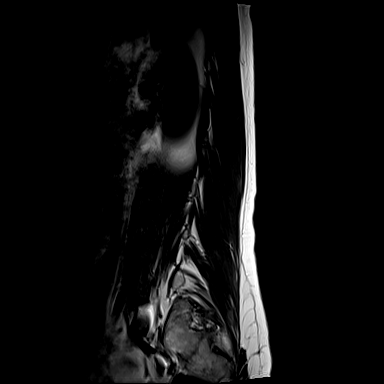
[im 3/15]
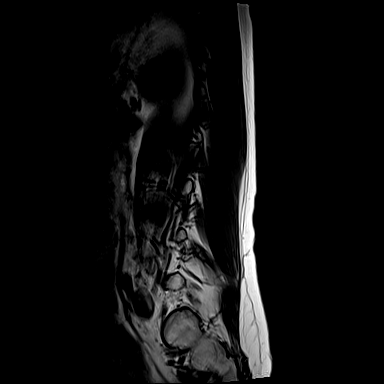
[im 6/15]
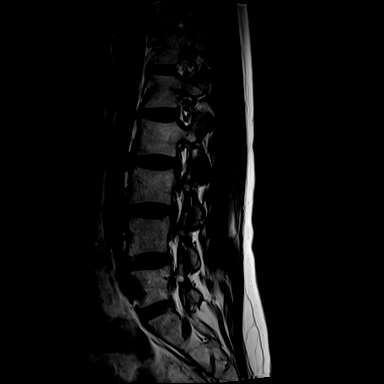
[im 9/15]
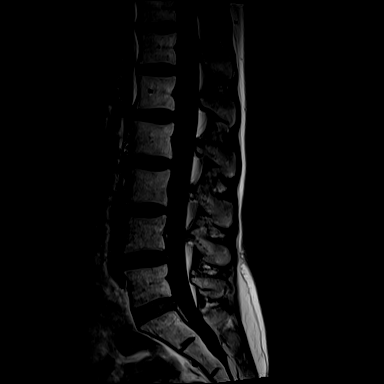
[im 15/15]
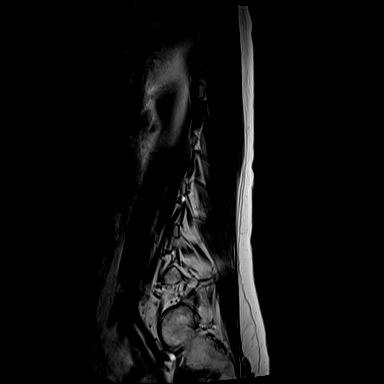

[Series 10: T1 · axial · 4.0mm · 0.28mm/px · z∈[-33,+117]mm · 3 of 41 slices shown (2 of 2)]
[im 6/41]
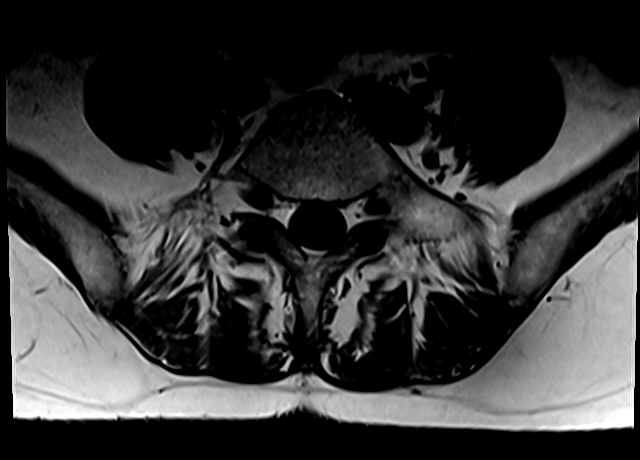
[im 21/41]
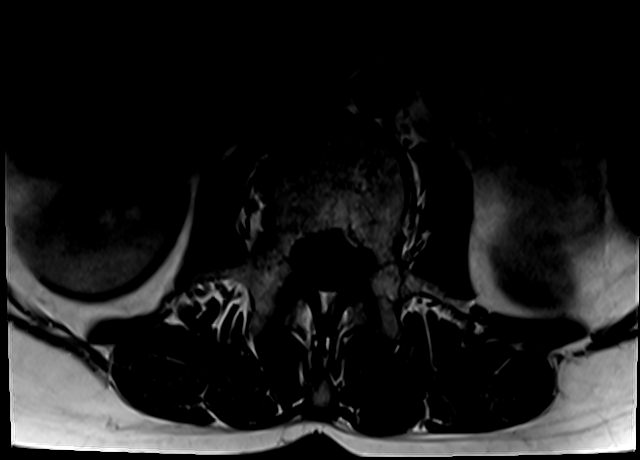
[im 35/41]
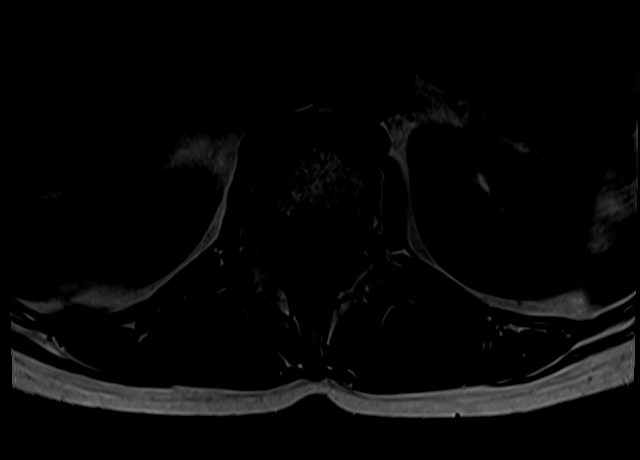

[Series 13: T2 · axial · 4.0mm · 0.28mm/px · z∈[-57,+147]mm · 9 of 41 slices shown (2 of 2)]
[im 1/41]
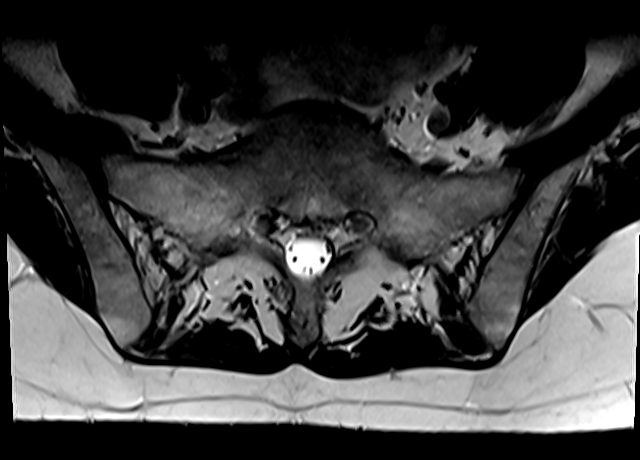
[im 6/41]
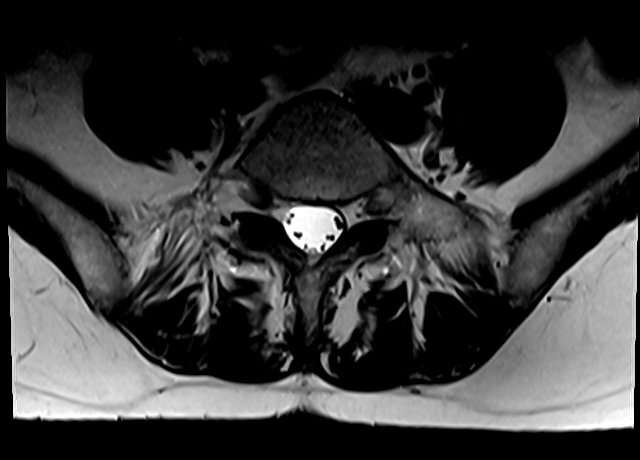
[im 12/41]
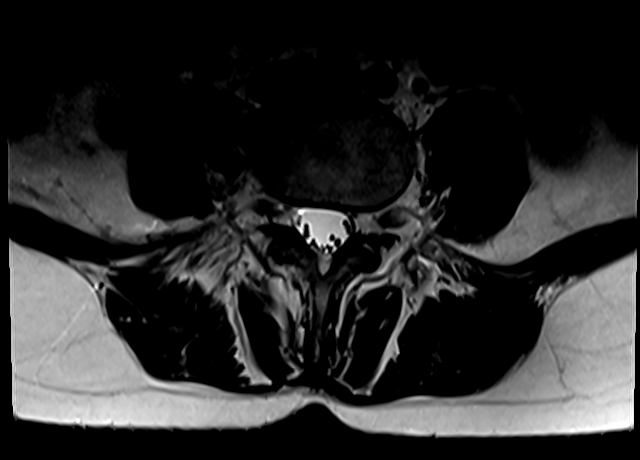
[im 18/41]
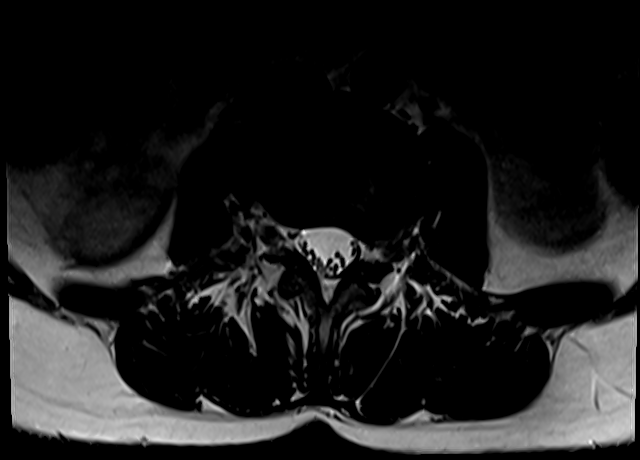
[im 21/41]
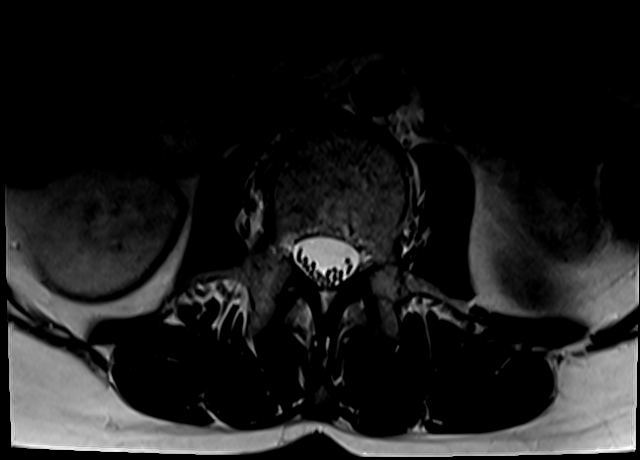
[im 23/41]
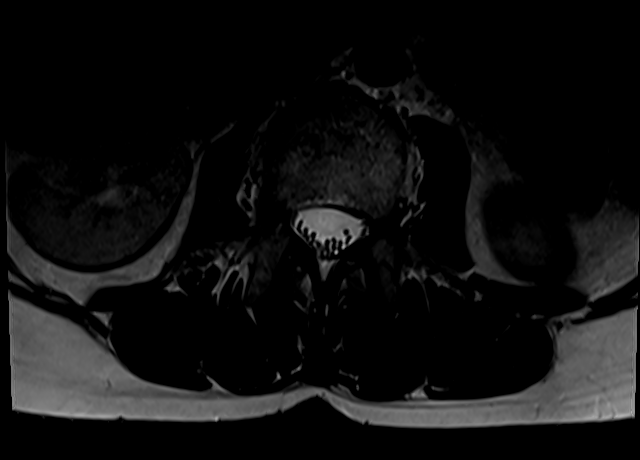
[im 29/41]
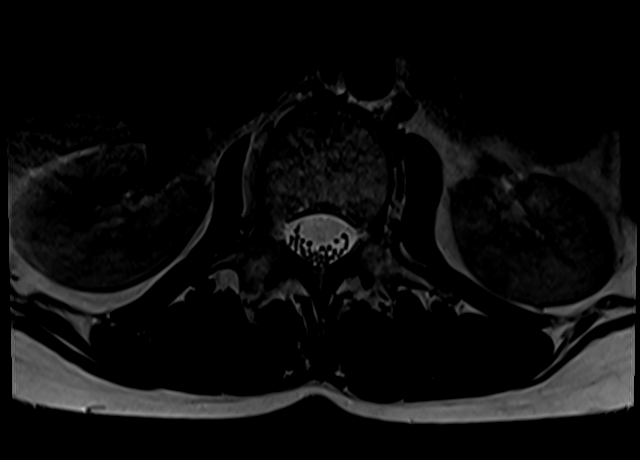
[im 35/41]
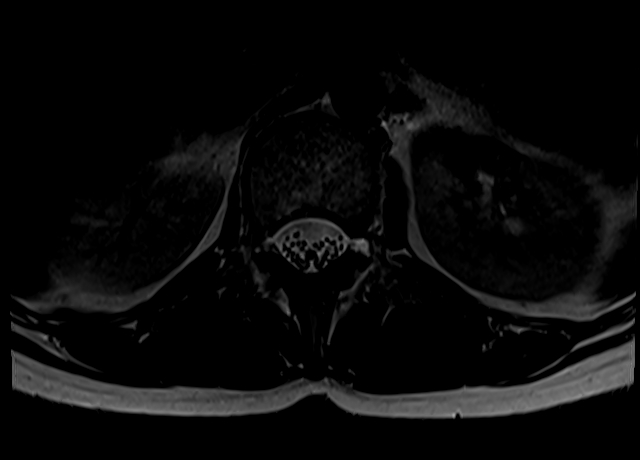
[im 41/41]
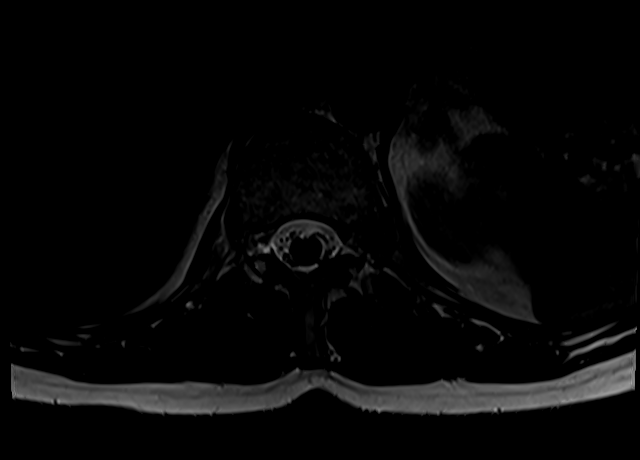

[23 of 48 positions shown; findings below may reference images not displayed]

FINDINGS: Segmentation:  Standard.

Alignment: Straightening of the lumbar lordosis. Levocurvature.
Anteroposterior alignment maintained.

Vertebrae: Vertebral body heights are preserved. No marrow edema. No
suspicious osseous lesion.

Conus medullaris and cauda equina: Conus extends to the L1 level.
Conus and cauda equina appear normal.

Paraspinal and other soft tissues: Unremarkable.

Disc levels:

L1-L2:  No canal or foraminal stenosis.

L2-L3:  No canal or foraminal stenosis.

L3-L4: New small left foraminal protrusion. No canal or right
foraminal stenosis. Minor left foraminal stenosis. The disc
protrusion abuts and may compress the exiting L3 nerve root.

L4-L5:  No canal or foraminal stenosis.

L5-S1:  No canal or foraminal stenosis.
IMPRESSION: New small left foraminal protrusion at L3-L4 abuts and may compress
the exiting L3 nerve root.

## 2023-04-16 ENCOUNTER — Other Ambulatory Visit: Payer: Self-pay

## 2023-04-16 ENCOUNTER — Encounter: Payer: No Typology Code available for payment source | Admitting: Nurse Practitioner

## 2023-04-16 DIAGNOSIS — F3341 Major depressive disorder, recurrent, in partial remission: Secondary | ICD-10-CM

## 2023-04-16 MED ORDER — SERTRALINE HCL 100 MG PO TABS: 100.0000 mg | ORAL_TABLET | Freq: Every day | ORAL | 0 refills | Status: DC

## 2023-05-21 ENCOUNTER — Encounter (HOSPITAL_BASED_OUTPATIENT_CLINIC_OR_DEPARTMENT_OTHER): Payer: Self-pay | Admitting: *Deleted

## 2023-05-21 ENCOUNTER — Encounter (HOSPITAL_BASED_OUTPATIENT_CLINIC_OR_DEPARTMENT_OTHER): Payer: Self-pay | Admitting: Family Medicine

## 2023-05-21 ENCOUNTER — Ambulatory Visit (HOSPITAL_BASED_OUTPATIENT_CLINIC_OR_DEPARTMENT_OTHER): Admitting: Family Medicine

## 2023-05-21 ENCOUNTER — Other Ambulatory Visit (HOSPITAL_BASED_OUTPATIENT_CLINIC_OR_DEPARTMENT_OTHER): Payer: Self-pay | Admitting: Family Medicine

## 2023-05-21 VITALS — BP 109/72 | HR 59 | Ht 64.0 in | Wt 118.0 lb

## 2023-05-21 DIAGNOSIS — Z7689 Persons encountering health services in other specified circumstances: Secondary | ICD-10-CM

## 2023-05-21 DIAGNOSIS — G4700 Insomnia, unspecified: Secondary | ICD-10-CM | POA: Diagnosis not present

## 2023-05-21 DIAGNOSIS — E876 Hypokalemia: Secondary | ICD-10-CM

## 2023-05-21 DIAGNOSIS — Z13220 Encounter for screening for lipoid disorders: Secondary | ICD-10-CM

## 2023-05-21 DIAGNOSIS — M818 Other osteoporosis without current pathological fracture: Secondary | ICD-10-CM

## 2023-05-21 DIAGNOSIS — G8929 Other chronic pain: Secondary | ICD-10-CM

## 2023-05-21 DIAGNOSIS — F3341 Major depressive disorder, recurrent, in partial remission: Secondary | ICD-10-CM | POA: Diagnosis not present

## 2023-05-21 DIAGNOSIS — Z0000 Encounter for general adult medical examination without abnormal findings: Secondary | ICD-10-CM

## 2023-05-21 DIAGNOSIS — M5442 Lumbago with sciatica, left side: Secondary | ICD-10-CM

## 2023-05-21 HISTORY — DX: Hypokalemia: E87.6

## 2023-05-21 MED ORDER — QUETIAPINE FUMARATE 25 MG PO TABS
25.0000 mg | ORAL_TABLET | Freq: Every day | ORAL | 2 refills | Status: DC
Start: 2023-05-21 — End: 2023-12-02

## 2023-05-21 MED ORDER — SERTRALINE HCL 100 MG PO TABS: 100.0000 mg | ORAL_TABLET | Freq: Every day | ORAL | 2 refills | Status: DC

## 2023-05-21 NOTE — Patient Instructions (Signed)
 Please return to office for fasting lab work at least 1 week prior to your next appointment.

## 2023-05-21 NOTE — Progress Notes (Signed)
 New Patient Office Visit  Subjective:   Victoria Ashley February 20, 1975 05/21/2023  Chief Complaint  Patient presents with   New Patient (Initial Visit)    Patient is here today to get established with the practice. Former PCP unexpectedly passed away.  Denies any main concerns for today's visit.    HPI: Victoria Ashley presents today to establish care at Primary Care [facility] Sports Medicine at [facility]. Introduced to Publishing rights manager role and practice setting.  All questions answered.   Last PCP: Glade Lloyd, MD Last annual physical: March 2024  Concerns: See below   CHRONIC BACK PAIN:  Victoria Ashley reports hx of osteoporosis and osteopenia. Patient has a hx of chronic back pain and is managed by Spinal Provider Dr. Lucila Maine. She is having a spinal ablation later today. She reports starting Prolia to help strengthen her bones so she may proceed with spinal surgery. Patient uses Baclofen as needed for chronic back pain.   OSTEOPOROSIS:  She was previously taking alendronate and has now begun Prolia injections every 6 months with [facility]ology at [facility].  Patient reports her last injection was in March 2025.  She reports she is tolerating well Patient has adequate calcium & vitamin D: yes, taking supplements daily Taking bisphosphonate's: Prolia injection Medication compliance: excellent compliance Weight bearing exercises: yes Recent falls: no most current DEXA scan available to this PCP is from 2022. Patient reports she has had an updated scan since this time frame.   Mild Hypokalemia:  Patient has hx of mild hypokalemia in the past 6-8 months. She denies taking diuretics, renal dysfunction, alcohol intake, or dietary changes. She does stay well hydrated. She denies symptoms of hypokalemia at this time.      Latest Ref Rng & Units 03/13/2023    3:03 PM 11/04/2022   11:00 AM 04/16/2022   10:36 AM  BMP  Glucose 70 - 99 mg/dL 981   88  70   BUN 6 - 20 mg/dL 11  14  12    Creatinine 0.44 - 1.00 mg/dL 1.91  4.78  2.95   BUN/Creat Ratio 6 - 22 (calc)   SEE NOTE:   Sodium 135 - 145 mmol/L 135  139  145   Potassium 3.5 - 5.1 mmol/L 3.2  3.3  4.9   Chloride 98 - 111 mmol/L 102  104  107   CO2 22 - 32 mmol/L 22  25  28    Calcium 8.9 - 10.3 mg/dL 8.9  9.5  62.1     DEPRESSION: Victoria Ashley presents for the medical management of depression. Patient needing refill of her medications. Reports using trazodone 50mg  as needed for primary insomnia.  Current medication regimen: Sertraline 100mg  every day, Quetiapine 25mg  qd Well controlled: Patient reports this is well controlled on current regimen.  Denies SI/HI.     05/21/2023    9:53 AM  PHQ9 SCORE ONLY  PHQ-9 Total Score 0     The following portions of the patient's history were reviewed and updated as appropriate: past medical history, past surgical history, family history, social history, allergies, medications, and problem list.   Patient Active Problem List   Diagnosis Date Noted   Hypokalemia 05/21/2023   Mixed hyperlipidemia 12/11/2020   B12 deficiency 08/09/2020   BMI 22.0-22.9, adult 08/09/2020   Chronic left-sided low back pain with left-sided sciatica 11/08/2019   Post-menopausal 11/11/2018   Vitamin D deficiency 10/02/2018   Migraine without status migrainosus, not  intractable 10/02/2018   COPD (chronic obstructive pulmonary disease) (HCC) 10/31/2017   Osteopenia 03/21/2017   Depression, major, recurrent, in partial remission (HCC) 09/18/2016   RLS (restless legs syndrome) 09/18/2016   Insomnia 09/18/2016   Genetic testing 02/16/2016   GAD (generalized anxiety disorder) 12/15/2015   Cigarette nicotine dependence without complication 12/15/2015   History of colitis 12/15/2014   Past Medical History:  Diagnosis Date   BMI less than 19,adult 04/17/2020   Low body weight due to inadequate caloric intake    Mild concussion 2022   Past Surgical  History:  Procedure Laterality Date   POSTERIOR LAMINECTOMY / DECOMPRESSION LUMBAR SPINE N/A 01/03/2021   L3-L4, Dr. Lamon Pillow, at surgery center   Family History  Problem Relation Age of Onset   Cancer Mother 35       breast cancer   Breast cancer Mother    Diabetes Father 26   Alcohol abuse Father    Cancer Maternal Grandmother 59       ovarian   Heart disease Maternal Grandfather    Social History   Socioeconomic History   Marital status: Married    Spouse name: Not on file   Number of children: Not on file   Years of education: Not on file   Highest education level: Not on file  Occupational History   Not on file  Tobacco Use   Smoking status: Former    Current packs/day: 0.25    Average packs/day: 0.3 packs/day for 20.0 years (5.0 ttl pk-yrs)    Types: Cigarettes   Smokeless tobacco: Never  Vaping Use   Vaping status: Never Used  Substance and Sexual Activity   Alcohol use: No    Alcohol/week: 0.0 standard drinks of alcohol   Drug use: No   Sexual activity: Yes    Birth control/protection: None    Comment: post menopausal  Other Topics Concern   Not on file  Social History Narrative   Part time Product/process development scientist   Social Drivers of Corporate investment banker Strain: Not on file  Food Insecurity: Not on file  Transportation Needs: Not on file  Physical Activity: Not on file  Stress: Not on file  Social Connections: Not on file  Intimate Partner Violence: Not on file   Outpatient Medications Prior to Visit  Medication Sig Dispense Refill   baclofen (LIORESAL) 10 MG tablet Take 1 tablet (10 mg total) by mouth 3 (three) times daily. 20 tablet 0   CALCIUM PO Take by mouth.     denosumab (PROLIA) 60 MG/ML SOSY injection Inject 60 mg into the skin every 6 (six) months.     estradiol-norethindrone (ACTIVELLA) 1-0.5 MG tablet Take 1 tablet by mouth daily.     traZODone (DESYREL) 50 MG tablet TAKE 1-1.5 TABS 1 HOUR PRIOR TO BEDTIME AS NEEDED FOR SLEEP. 135 tablet 2    VITAMIN D, CHOLECALCIFEROL, PO Take 5,000 Units by mouth.     alendronate (FOSAMAX) 70 MG tablet      QUEtiapine (SEROQUEL) 25 MG tablet TAKE 1/2 - 1 TABLET (12.5-25 MG TOTAL) BY MOUTH AT BEDTIME. 90 tablet 0   sertraline (ZOLOFT) 100 MG tablet Take 1 tablet (100 mg total) by mouth daily. Take 1 and 1/2 tabs daily 90 tablet 0   oxyCODONE (ROXICODONE) 5 MG immediate release tablet Take 1 tablet (5 mg total) by mouth every 4 (four) hours as needed for severe pain (pain score 7-10). 10 tablet 0   No facility-administered medications prior to visit.  No Known Allergies  ROS: A complete ROS was performed with pertinent positives/negatives noted in the HPI. The remainder of the ROS are negative.   Objective:   Today's Vitals   05/21/23 0948  BP: 109/72  Pulse: (!) 59  SpO2: 100%  Weight: 118 lb (53.5 kg)  Height: 5\' 4"  (1.626 m)    GENERAL: Well-appearing, in NAD. Well nourished.  SKIN: Pink, warm and dry.  Head: Normocephalic. NECK: Trachea midline. Full ROM w/o pain or tenderness.  RESPIRATORY: Chest wall symmetrical. Respirations even and non-labored. Breath sounds clear to auscultation bilaterally.  CARDIAC: S1, S2 present, regular rate and rhythm without murmur or gallops. Peripheral pulses 2+ bilaterally.  MSK: Muscle tone and strength appropriate for age.  NEUROLOGIC: No motor or sensory deficits. Steady, even gait. C2-C12 intact.  PSYCH/MENTAL STATUS: Alert, oriented x 3. Cooperative, appropriate mood and affect.    Health Maintenance Due  Topic Date Due   Hepatitis C Screening  Never done   DTaP/Tdap/Td (1 - Tdap) Never done   Pneumococcal Vaccine 23-33 Years old (1 of 2 - PCV) Never done   Cervical Cancer Screening (HPV/Pap Cotest)  01/05/2017   Colonoscopy  Never done   MAMMOGRAM  02/28/2021   COVID-19 Vaccine (4 - 2024-25 season) 10/06/2022    No results found for any visits on 05/21/23.     Assessment & Plan:   1. Encounter to establish care with new doctor  (Primary) Discussed role of PCP with patient and will obtain records from patient's OB/GYN and GI provider to update her preventative screenings such as Pap, mammogram, colonoscopy.  When patient obtain fasting lab prior to her follow-up annual exam.  2. Depression, major, recurrent, in partial remission (HCC) Well-controlled.  Medications reviewed and safe use of medication reviewed with patient.  Safety plan reviewed - sertraline (ZOLOFT) 100 MG tablet; Take 1 tablet (100 mg total) by mouth daily.  Dispense: 90 tablet; Refill: 2 - QUEtiapine (SEROQUEL) 25 MG tablet; Take 1 tablet (25 mg total) by mouth at bedtime.  Dispense: 90 tablet; Refill: 2  3. Insomnia, unspecified type Well-controlled.  Continue current regimen and safe use of medication reviewed - QUEtiapine (SEROQUEL) 25 MG tablet; Take 1 tablet (25 mg total) by mouth at bedtime.  Dispense: 90 tablet; Refill: 2  4. Hypokalemia Patient currently asymptomatic.  We discussed possible causes and effects of hypokalemia.  Will recheck BMP with lab work.  Patient will return to get this drawn within the next week.  5. Chronic left-sided low back pain with left-sided sciatica Currently managed by spinal provider.  Will continue management and obtain records from patient's specialist.  6. Other osteoporosis without current pathological fracture Currently managed by [facility] rheumatology.  We discussed benefits and risks of Prolia and will allow rheumatology to continue monitoring with DEXA and vitamin D.   Patient to reach out to office if new, worrisome, or unresolved symptoms arise or if no improvement in patient's condition. Patient verbalized understanding and is agreeable to treatment plan. All questions answered to patient's satisfaction.    Return in about 5 weeks (around 06/25/2023) for ANNUAL PHYSICAL (fasting labs prior) .    Nonda Bays, [place]

## 2023-06-01 ENCOUNTER — Other Ambulatory Visit: Payer: Self-pay | Admitting: Family

## 2023-06-01 DIAGNOSIS — F3341 Major depressive disorder, recurrent, in partial remission: Secondary | ICD-10-CM

## 2023-06-16 ENCOUNTER — Encounter (HOSPITAL_BASED_OUTPATIENT_CLINIC_OR_DEPARTMENT_OTHER): Payer: Self-pay | Admitting: Student

## 2023-06-16 ENCOUNTER — Ambulatory Visit (HOSPITAL_BASED_OUTPATIENT_CLINIC_OR_DEPARTMENT_OTHER): Admitting: Student

## 2023-06-16 ENCOUNTER — Other Ambulatory Visit (HOSPITAL_BASED_OUTPATIENT_CLINIC_OR_DEPARTMENT_OTHER)

## 2023-06-16 ENCOUNTER — Ambulatory Visit (HOSPITAL_BASED_OUTPATIENT_CLINIC_OR_DEPARTMENT_OTHER)

## 2023-06-16 ENCOUNTER — Other Ambulatory Visit (HOSPITAL_BASED_OUTPATIENT_CLINIC_OR_DEPARTMENT_OTHER): Payer: Self-pay

## 2023-06-16 DIAGNOSIS — Z0000 Encounter for general adult medical examination without abnormal findings: Secondary | ICD-10-CM

## 2023-06-16 DIAGNOSIS — M5412 Radiculopathy, cervical region: Secondary | ICD-10-CM

## 2023-06-16 DIAGNOSIS — M542 Cervicalgia: Secondary | ICD-10-CM | POA: Diagnosis not present

## 2023-06-16 DIAGNOSIS — M25511 Pain in right shoulder: Secondary | ICD-10-CM | POA: Diagnosis not present

## 2023-06-16 DIAGNOSIS — E876 Hypokalemia: Secondary | ICD-10-CM

## 2023-06-16 DIAGNOSIS — Z13220 Encounter for screening for lipoid disorders: Secondary | ICD-10-CM

## 2023-06-16 MED ORDER — TRAMADOL HCL 50 MG PO TABS
50.0000 mg | ORAL_TABLET | Freq: Four times a day (QID) | ORAL | 0 refills | Status: AC | PRN
  Filled 2023-06-16: qty 12, 3d supply, fill #0

## 2023-06-16 NOTE — Progress Notes (Signed)
 Chief Complaint: Right shoulder pain    Discussed the use of AI scribe software for clinical note transcription with the patient, who gave verbal consent to proceed.  History of Present Illness Victoria Ashley is a 48 year old female with osteoporosis who presents with right shoulder and arm pain. She experiences significant pain in her right shoulder blade, radiating down her arm to the elbow, with occasional pain in the front of the shoulder. This is her third episode, with the current one being the most severe and lasting for a week. The pain began suddenly without any specific activity or injury.  She uses ibuprofen , Tylenol , and a muscle relaxer for pain management, but continues to have significant pain and difficulty using her right arm, affecting her daily activities. 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 Her medical history includes osteoporosis, managed with Prolia injections, and a recent lumbar ablation for pain management. Despite the ablation, she experiences episodes of back immobility. She has difficulty sleeping due to pain, even with trazodone . There some neck pain but no numbness or tingling in the arm.  Surgical History:   None  PMH/PSH/Family History/Social History/Meds/Allergies:    Past Medical History:  Diagnosis Date   BMI less than 19,adult 04/17/2020   Low body weight due to inadequate caloric intake    Mild concussion 2022   Past Surgical History:  Procedure Laterality Date   POSTERIOR LAMINECTOMY / DECOMPRESSION LUMBAR SPINE N/A 01/03/2021   L3-L4, Dr. Lamon Pillow, at surgery center   Social History   Socioeconomic History   Marital status: Married    Spouse name: Not on file   Number of children: Not on file   Years of education: Not on file   Highest education level: Not on file  Occupational History   Not on file  Tobacco Use   Smoking status: Former    Current  packs/day: 0.25    Average packs/day: 0.3 packs/day for 20.0 years (5.0 ttl pk-yrs)    Types: Cigarettes   Smokeless tobacco: Never  Vaping Use   Vaping status: Never Used  Substance and Sexual Activity   Alcohol use: No    Alcohol/week: 0.0 standard drinks of alcohol   Drug use: No   Sexual activity: Yes    Birth control/protection: None    Comment: post menopausal  Other Topics Concern   Not on file  Social History Narrative   Part time Product/process development scientist   Social Drivers of Corporate investment banker Strain: Not on file  Food Insecurity: Not on file  Transportation Needs: Not on file  Physical Activity: Not on file  Stress: Not on file  Social Connections: Not on file   Family History  Problem Relation Age of Onset   Cancer Mother 41       breast cancer   Breast cancer Mother    Diabetes Father 67   Alcohol abuse Father    Cancer Maternal Grandmother 9       ovarian   Heart disease Maternal Grandfather    No Known Allergies Current Outpatient Medications  Medication Sig Dispense Refill   traMADol  (ULTRAM ) 50 MG tablet Take 1 tablet (50 mg total) by mouth every 6 (six) hours as needed for up to 3 days. 12 tablet 0  baclofen  (LIORESAL ) 10 MG tablet Take 1 tablet (10 mg total) by mouth 3 (three) times daily. 20 tablet 0   CALCIUM PO Take by mouth.     denosumab (PROLIA) 60 MG/ML SOSY injection Inject 60 mg into the skin every 6 (six) months.     estradiol-norethindrone (ACTIVELLA) 1-0.5 MG tablet Take 1 tablet by mouth daily.     QUEtiapine  (SEROQUEL ) 25 MG tablet Take 1 tablet (25 mg total) by mouth at bedtime. 90 tablet 2   sertraline  (ZOLOFT ) 100 MG tablet Take 1 tablet (100 mg total) by mouth daily. 90 tablet 2   traZODone  (DESYREL ) 50 MG tablet TAKE 1-1.5 TABS 1 HOUR PRIOR TO BEDTIME AS NEEDED FOR SLEEP. 135 tablet 2   VITAMIN D , CHOLECALCIFEROL, PO Take 5,000 Units by mouth.     No current facility-administered medications for this visit.   No results  found.  Review of Systems:   A ROS was performed including pertinent positives and negatives as documented in the HPI.  Physical Exam :   Constitutional: NAD and appears stated age Neurological: Alert and oriented Psych: Appropriate affect and cooperative There were no vitals taken for this visit.   Comprehensive Musculoskeletal Exam:    Some tenderness with palpation in the cervical spine and paraspinal muscles.  Painful and limited cervical ROM in all planes.  Significant tenderness with palpation around the right scapula.  Active and passive shoulder flexion to 40 degrees with the ER to 20 degrees.  Right hand grip strength 3/5 compared to 5/5 contralateral side.  Special shoulder testing limited due to significant pain and guarding.  Imaging:   Xray (cervical spine 4 views): Intervertebral disc spaces well-maintained with mild anterior spurring noted between C4-C6.  Mild retrolisthesis of C6.  No evidence of acute abnormality.  Xray (right shoulder 3 views): Minimal degenerative changes noted within the glenohumeral joint but otherwise negative for any remarkable bony abnormality   I personally reviewed and interpreted the radiographs.      Assessment & Plan Cervical radiculopathy   Acute cervical radiculopathy presents with pain radiating from the neck to the right shoulder and arm.  No signs or symptoms at this time to suggest involvement of true shoulder pathology.  Conservative management is preferred. Refer to physical therapy for management. Prescribe tramadol  for short-term pain relief as needed. Recommend a cervical traction pillow for short-term relief. Consider oral steroids if symptoms do not improve, although she has had side effects with this in the past including insomnia. Schedule follow-up in 3-4 weeks or sooner if symptoms worsen.     I personally saw and evaluated the patient, and participated in the management and treatment plan.  Sharrell Deck,  PA-C Orthopedics

## 2023-06-17 LAB — COMPREHENSIVE METABOLIC PANEL WITH GFR
ALT: 12 IU/L (ref 0–32)
AST: 16 IU/L (ref 0–40)
Albumin: 4.8 g/dL (ref 3.9–4.9)
Alkaline Phosphatase: 60 IU/L (ref 44–121)
BUN/Creatinine Ratio: 15 (ref 9–23)
BUN: 12 mg/dL (ref 6–24)
Bilirubin Total: 0.3 mg/dL (ref 0.0–1.2)
CO2: 22 mmol/L (ref 20–29)
Calcium: 9.3 mg/dL (ref 8.7–10.2)
Chloride: 101 mmol/L (ref 96–106)
Creatinine, Ser: 0.81 mg/dL (ref 0.57–1.00)
Globulin, Total: 2.3 g/dL (ref 1.5–4.5)
Glucose: 83 mg/dL (ref 70–99)
Potassium: 4.3 mmol/L (ref 3.5–5.2)
Sodium: 141 mmol/L (ref 134–144)
Total Protein: 7.1 g/dL (ref 6.0–8.5)
eGFR: 90 mL/min/{1.73_m2} (ref >59)

## 2023-06-17 LAB — CBC WITH DIFFERENTIAL/PLATELET
Basophils Absolute: 0.1 10*3/uL (ref 0.0–0.2)
Basos: 1 %
EOS (ABSOLUTE): 0.2 10*3/uL (ref 0.0–0.4)
Eos: 3 %
Hematocrit: 42 % (ref 34.0–46.6)
Hemoglobin: 13.7 g/dL (ref 11.1–15.9)
Immature Grans (Abs): 0 10*3/uL (ref 0.0–0.1)
Immature Granulocytes: 0 %
Lymphocytes Absolute: 2.2 10*3/uL (ref 0.7–3.1)
Lymphs: 33 %
MCH: 30.2 pg (ref 26.6–33.0)
MCHC: 32.6 g/dL (ref 31.5–35.7)
MCV: 93 fL (ref 79–97)
Monocytes Absolute: 0.6 10*3/uL (ref 0.1–0.9)
Monocytes: 9 %
Neutrophils Absolute: 3.5 10*3/uL (ref 1.4–7.0)
Neutrophils: 54 %
Platelets: 231 10*3/uL (ref 150–450)
RBC: 4.53 x10E6/uL (ref 3.77–5.28)
RDW: 13.4 % (ref 11.7–15.4)
WBC: 6.7 10*3/uL (ref 3.4–10.8)

## 2023-06-17 LAB — LIPID PANEL
Chol/HDL Ratio: 2.9 ratio (ref 0.0–4.4)
Cholesterol, Total: 205 mg/dL — ABNORMAL HIGH (ref 100–199)
HDL: 71 mg/dL (ref >39)
LDL Chol Calc (NIH): 122 mg/dL — ABNORMAL HIGH (ref 0–99)
Triglycerides: 65 mg/dL (ref 0–149)
VLDL Cholesterol Cal: 12 mg/dL (ref 5–40)

## 2023-06-18 ENCOUNTER — Ambulatory Visit (HOSPITAL_BASED_OUTPATIENT_CLINIC_OR_DEPARTMENT_OTHER): Payer: Self-pay | Admitting: Family Medicine

## 2023-06-18 NOTE — Progress Notes (Signed)
 Hi Victoria Ashley,  Your potassium level has returned to baseline and your kidney and liver function is normal. Your cholesterol is slightly elevated and is similar to prior elevation. We can discuss this at your visit in July. This is not at a level recommending medication at this time. Your blood counts are stable.

## 2023-07-07 ENCOUNTER — Ambulatory Visit (HOSPITAL_BASED_OUTPATIENT_CLINIC_OR_DEPARTMENT_OTHER): Admitting: Student

## 2023-08-07 ENCOUNTER — Encounter (HOSPITAL_BASED_OUTPATIENT_CLINIC_OR_DEPARTMENT_OTHER): Payer: Self-pay

## 2023-08-07 ENCOUNTER — Ambulatory Visit (HOSPITAL_BASED_OUTPATIENT_CLINIC_OR_DEPARTMENT_OTHER): Admitting: Family Medicine

## 2023-10-14 ENCOUNTER — Other Ambulatory Visit: Payer: Self-pay | Admitting: Nurse Practitioner

## 2023-10-14 DIAGNOSIS — M5416 Radiculopathy, lumbar region: Secondary | ICD-10-CM

## 2023-10-15 ENCOUNTER — Encounter: Payer: Self-pay | Admitting: Nurse Practitioner

## 2023-10-18 ENCOUNTER — Ambulatory Visit
Admission: RE | Admit: 2023-10-18 | Discharge: 2023-10-18 | Disposition: A | Discharge status: Home / Self Care | Source: Ambulatory Visit | Attending: Nurse Practitioner | Admitting: Nurse Practitioner

## 2023-10-18 DIAGNOSIS — M5416 Radiculopathy, lumbar region: Secondary | ICD-10-CM

## 2023-12-02 ENCOUNTER — Encounter (HOSPITAL_BASED_OUTPATIENT_CLINIC_OR_DEPARTMENT_OTHER): Payer: Self-pay | Admitting: Family Medicine

## 2023-12-02 ENCOUNTER — Ambulatory Visit (INDEPENDENT_AMBULATORY_CARE_PROVIDER_SITE_OTHER): Admitting: Family Medicine

## 2023-12-02 VITALS — BP 110/64 | HR 64 | Ht 64.0 in | Wt 127.0 lb

## 2023-12-02 DIAGNOSIS — G4700 Insomnia, unspecified: Secondary | ICD-10-CM | POA: Diagnosis not present

## 2023-12-02 DIAGNOSIS — Z0000 Encounter for general adult medical examination without abnormal findings: Secondary | ICD-10-CM | POA: Diagnosis not present

## 2023-12-02 DIAGNOSIS — F3341 Major depressive disorder, recurrent, in partial remission: Secondary | ICD-10-CM | POA: Diagnosis not present

## 2023-12-02 DIAGNOSIS — Z23 Encounter for immunization: Secondary | ICD-10-CM

## 2023-12-02 MED ORDER — SERTRALINE HCL 100 MG PO TABS
100.0000 mg | ORAL_TABLET | Freq: Every day | ORAL | 3 refills | Status: AC
Start: 2023-12-02 — End: ?

## 2023-12-02 MED ORDER — QUETIAPINE FUMARATE 25 MG PO TABS: 25.0000 mg | ORAL_TABLET | Freq: Every day | ORAL | 3 refills | Status: AC

## 2023-12-02 NOTE — Progress Notes (Addendum)
 Subjective:   Victoria Ashley Oct 25, 1975  12/02/2023   CC: Chief Complaint  Patient presents with   Medical Management of Chronic Issues    Pt states she is here for a follow up from prior visit and other visits that she missed. Denies any real concerns for today's visit.    HPI: Victoria Ashley is a 48 y.o. female who presents for a routine health maintenance exam.    HEALTH SCREENINGS: - Vision Screening: up to date - Dental Visits: up to date - Pap smear: up to date - Breast Exam: Declined - STD Screening: Declined - Mammogram (40+): UTD per patient; will obtain records  - Colonoscopy (45+): Up to date  - Bone Density (65+ or under 65 with predisposing conditions): Up to date  - Lung CA screening with low-dose CT:  Not applicable Adults age 58-80 who are current cigarette smokers or quit within the last 15 years. Must have 20 pack year history.   Depression and Anxiety Screen done today and results listed below:     12/02/2023    3:11 PM 05/21/2023    9:53 AM  Depression screen PHQ 2/9  Decreased Interest 0 0  Down, Depressed, Hopeless 0 0  PHQ - 2 Score 0 0  Altered sleeping 0 0  Tired, decreased energy 0 0  Change in appetite 0 0  Feeling bad or failure about yourself  0 0  Trouble concentrating 0 0  Moving slowly or fidgety/restless 0 0  Suicidal thoughts 0 0  PHQ-9 Score 0 0  Difficult doing work/chores Not difficult at all Not difficult at all      12/02/2023    3:11 PM 05/21/2023    9:53 AM  GAD 7 : Generalized Anxiety Score  Nervous, Anxious, on Edge 0 0  Control/stop worrying 0 0  Worry too much - different things 0 0  Trouble relaxing 0 0  Restless 0 0  Easily annoyed or irritable 0 0  Afraid - awful might happen 0 0  Total GAD 7 Score 0 0  Anxiety Difficulty Not difficult at all Not difficult at all    IMMUNIZATIONS: - Tdap: Tetanus vaccination status reviewed: Declined. - HPV: Not applicable - Influenza: Administered today - Pneumovax:  Not applicable - Prevnar 20: Not applicable - Shingrix (50+): Not applicable   Past medical history, surgical history, medications, allergies, family history and social history reviewed with patient today and changes made to appropriate areas of the chart.   Past Medical History:  Diagnosis Date   BMI less than 19,adult 04/17/2020   Cigarette nicotine dependence without complication 12/15/2015   Genetic testing 02/16/2016   Overview:   Patient had a negative Breast and Gyn Cancers Guidelines-Based panel through [facility] on 01/24/16.  This panel has 19 genes.      History of colitis 12/15/2014   Hypokalemia 05/21/2023   Low body weight due to inadequate caloric intake    Mild concussion 2022    Past Surgical History:  Procedure Laterality Date   POSTERIOR LAMINECTOMY / DECOMPRESSION LUMBAR SPINE N/A 01/03/2021   L3-L4, Dr. Onetha, at surgery center    Current Outpatient Medications on File Prior to Visit  Medication Sig   baclofen  (LIORESAL ) 10 MG tablet Take 1 tablet (10 mg total) by mouth 3 (three) times daily.   CALCIUM PO Take by mouth.   denosumab (PROLIA) 60 MG/ML SOSY injection Inject 60 mg into the skin every 6 (six) months.   estradiol-norethindrone (ACTIVELLA) 1-0.5  MG tablet Take 1 tablet by mouth daily.   traZODone  (DESYREL ) 50 MG tablet TAKE 1-1.5 TABS 1 HOUR PRIOR TO BEDTIME AS NEEDED FOR SLEEP.   VITAMIN D , CHOLECALCIFEROL, PO Take 5,000 Units by mouth.   No current facility-administered medications on file prior to visit.    No Known Allergies   Social History   Socioeconomic History   Marital status: Married    Spouse name: Not on file   Number of children: Not on file   Years of education: Not on file   Highest education level: 12th grade  Occupational History   Not on file  Tobacco Use   Smoking status: Former    Current packs/day: 0.25    Average packs/day: 0.3 packs/day for 20.0 years (5.0 ttl pk-yrs)    Types: Cigarettes   Smokeless tobacco:  Never  Vaping Use   Vaping status: Never Used  Substance and Sexual Activity   Alcohol use: No    Alcohol/week: 0.0 standard drinks of alcohol   Drug use: No   Sexual activity: Yes    Birth control/protection: None    Comment: post menopausal  Other Topics Concern   Not on file  Social History Narrative   Part time Auditor   Social Drivers of Health   Financial Resource Strain: Low Risk  (08/07/2023)   Overall Financial Resource Strain (CARDIA)    Difficulty of Paying Living Expenses: Not hard at all  Food Insecurity: No Food Insecurity (08/07/2023)   Hunger Vital Sign    Worried About Running Out of Food in the Last Year: Never true    Ran Out of Food in the Last Year: Never true  Transportation Needs: No Transportation Needs (08/07/2023)   PRAPARE - Administrator, Civil Service (Medical): No    Lack of Transportation (Non-Medical): No  Physical Activity: Inactive (08/07/2023)   Exercise Vital Sign    Days of Exercise per Week: 0 days    Minutes of Exercise per Session: Not on file  Stress: No Stress Concern Present (08/07/2023)   [employer] of Occupational Health - Occupational Stress Questionnaire    Feeling of Stress: Only a little  Social Connections: Moderately Isolated (08/07/2023)   Social Connection and Isolation Panel    Frequency of Communication with Friends and Family: Twice a week    Frequency of Social Gatherings with Friends and Family: Twice a week    Attends Religious Services: Patient declined    Database Administrator or Organizations: No    Attends Engineer, Structural: Not on file    Marital Status: Married  Catering Manager Violence: Not on file   Social History   Tobacco Use  Smoking Status Former   Current packs/day: 0.25   Average packs/day: 0.3 packs/day for 20.0 years (5.0 ttl pk-yrs)   Types: Cigarettes  Smokeless Tobacco Never   Social History   Substance and Sexual Activity  Alcohol Use No   Alcohol/week: 0.0  standard drinks of alcohol    Family History  Problem Relation Age of Onset   Cancer Mother 70       breast cancer   Breast cancer Mother    Diabetes Father 32   Alcohol abuse Father    Cancer Maternal Grandmother 44       ovarian   Heart disease Maternal Grandfather      ROS: Denies fever, fatigue, unexplained weight loss/gain, chest pain, SHOB, and palpitations. Denies neurological deficits, gastrointestinal or genitourinary complaints, and  skin changes.   Objective:   Today's Vitals   12/02/23 1507  BP: 110/64  Pulse: 64  SpO2: 98%  Weight: 127 lb (57.6 kg)  Height: 5' 4 (1.626 m)    GENERAL APPEARANCE: Well-appearing, in NAD. Well nourished.  SKIN: Pink, warm and dry. Turgor normal. No rash, lesion, ulceration, or ecchymoses. Hair evenly distributed.  HEENT: HEAD: Normocephalic.  EYES: PERRLA. EOMI. Lids intact w/o defect. Sclera white, Conjunctiva pink w/o exudate.  EARS: External ear w/o redness, swelling, masses or lesions. EAC clear. TM's intact, translucent w/o bulging, appropriate landmarks visualized. Appropriate acuity to conversational tones.  NOSE: Septum midline w/o deformity. Nares patent, mucosa pink and non-inflamed w/o drainage. No sinus tenderness.  THROAT: Uvula midline. Oropharynx clear. Tonsils non-inflamed w/o exudate. Oral mucosa pink and moist.  NECK: Supple, Trachea midline. Full ROM w/o pain or tenderness. No lymphadenopathy. Thyroid  non-tender w/o enlargement or palpable masses.  RESPIRATORY: Chest wall symmetrical w/o masses. Respirations even and non-labored. Breath sounds clear to auscultation bilaterally. No wheezes, rales, rhonchi, or crackles. CARDIAC: S1, S2 present, regular rate and rhythm. No gallops, murmurs, rubs, or clicks. PMI w/o lifts, heaves, or thrills. No carotid bruits. Capillary refill <2 seconds. Peripheral pulses 2+ bilaterally. GI: Abdomen soft w/o distention. Normoactive bowel sounds. No palpable masses or tenderness. No  guarding or rebound tenderness. Liver and spleen w/o tenderness or enlargement. No CVA tenderness.  MSK: Muscle tone and strength appropriate for age, w/o atrophy or abnormal movement.  EXTREMITIES: Active ROM intact, w/o tenderness, crepitus, or contracture. No obvious joint deformities or effusions. No clubbing, edema, or cyanosis.  NEUROLOGIC: CN's II-XII intact. Motor strength symmetrical with no obvious weakness. No sensory deficits. DTR's 2+ symmetric bilaterally. Steady, even gait.  PSYCH/MENTAL STATUS: Alert, oriented x 3. Cooperative, appropriate mood and affect.    Results for orders placed or performed in visit on 06/16/23  Lipid panel   Collection Time: 06/16/23  9:47 AM  Result Value Ref Range   Cholesterol, Total 205 (H) 100 - 199 mg/dL   Triglycerides 65 0 - 149 mg/dL   HDL 71 >60 mg/dL   VLDL Cholesterol Cal 12 5 - 40 mg/dL   LDL Chol Calc (NIH) 877 (H) 0 - 99 mg/dL   Chol/HDL Ratio 2.9 0.0 - 4.4 ratio  Comprehensive metabolic panel with GFR   Collection Time: 06/16/23  9:47 AM  Result Value Ref Range   Glucose 83 70 - 99 mg/dL   BUN 12 6 - 24 mg/dL   Creatinine, Ser 9.18 0.57 - 1.00 mg/dL   eGFR 90 >40 fO/fpw/8.26   BUN/Creatinine Ratio 15 9 - 23   Sodium 141 134 - 144 mmol/L   Potassium 4.3 3.5 - 5.2 mmol/L   Chloride 101 96 - 106 mmol/L   CO2 22 20 - 29 mmol/L   Calcium 9.3 8.7 - 10.2 mg/dL   Total Protein 7.1 6.0 - 8.5 g/dL   Albumin 4.8 3.9 - 4.9 g/dL   Globulin, Total 2.3 1.5 - 4.5 g/dL   Bilirubin Total 0.3 0.0 - 1.2 mg/dL   Alkaline Phosphatase 60 44 - 121 IU/L   AST 16 0 - 40 IU/L   ALT 12 0 - 32 IU/L  CBC with Differential/Platelet   Collection Time: 06/16/23  9:47 AM  Result Value Ref Range   WBC 6.7 3.4 - 10.8 x10E3/uL   RBC 4.53 3.77 - 5.28 x10E6/uL   Hemoglobin 13.7 11.1 - 15.9 g/dL   Hematocrit 57.9 65.9 - 46.6 %  MCV 93 79 - 97 fL   MCH 30.2 26.6 - 33.0 pg   MCHC 32.6 31.5 - 35.7 g/dL   RDW 86.5 88.2 - 84.5 %   Platelets 231 150 - 450  x10E3/uL   Neutrophils 54 Not Estab. %   Lymphs 33 Not Estab. %   Monocytes 9 Not Estab. %   Eos 3 Not Estab. %   Basos 1 Not Estab. %   Neutrophils Absolute 3.5 1.4 - 7.0 x10E3/uL   Lymphocytes Absolute 2.2 0.7 - 3.1 x10E3/uL   Monocytes Absolute 0.6 0.1 - 0.9 x10E3/uL   EOS (ABSOLUTE) 0.2 0.0 - 0.4 x10E3/uL   Basophils Absolute 0.1 0.0 - 0.2 x10E3/uL   Immature Granulocytes 0 Not Estab. %   Immature Grans (Abs) 0.0 0.0 - 0.1 x10E3/uL    Assessment & Plan:  1. Annual physical exam (Primary) Discussed preventative screenings, vaccines, and healthy lifestyle with patient. Patient's labs within the past 6 months reviewed without indication to repeat today.   2. Depression, major, recurrent, in partial remission Controlled. Safety plan and safe use of medications reviewed. Will continue current regimen.  - QUEtiapine  (SEROQUEL ) 25 MG tablet; Take 1 tablet (25 mg total) by mouth at bedtime.  Dispense: 90 tablet; Refill: 3 - sertraline  (ZOLOFT ) 100 MG tablet; Take 1 tablet (100 mg total) by mouth daily.  Dispense: 90 tablet; Refill: 3  3. Insomnia, unspecified type Controlled currently. Safe use reviewed.  - QUEtiapine  (SEROQUEL ) 25 MG tablet; Take 1 tablet (25 mg total) by mouth at bedtime.  Dispense: 90 tablet; Refill: 3  4. Immunization due - Flu vaccine trivalent PF, 6mos and older(Flulaval,Afluria,Fluarix,Fluzone)   Orders Placed This Encounter  Procedures   Flu vaccine trivalent PF, 6mos and older(Flulaval,Afluria,Fluarix,Fluzone)    PATIENT COUNSELING:  - Encouraged a healthy well-balanced diet. Patient may adjust caloric intake to maintain or achieve ideal body weight. May reduce intake of dietary saturated fat and total fat and have adequate dietary potassium and calcium preferably from fresh fruits, vegetables, and low-fat dairy products.   - Advised to avoid cigarette smoking. - Discussed with the patient that most people either abstain from alcohol or drink within safe  limits (<=14/week and <=4 drinks/occasion for males, <=7/weeks and <= 3 drinks/occasion for females) and that the risk for alcohol disorders and other health effects rises proportionally with the number of drinks per week and how often a drinker exceeds daily limits. - Discussed cessation/primary prevention of drug use and availability of treatment for abuse.  - Discussed sexually transmitted diseases, avoidance of unintended pregnancy and contraceptive alternatives.  - Stressed the importance of regular exercise - Injury prevention: Discussed safety belts, safety helmets, smoke detector, smoking near bedding or upholstery.  - Dental health: Discussed importance of regular tooth brushing, flossing, and dental visits.   NEXT PREVENTATIVE PHYSICAL DUE IN 1 YEAR.  Return in about 1 year (around 12/01/2024) for ANNUAL PHYSICAL.  Patient to reach out to office if new, worrisome, or unresolved symptoms arise or if no improvement in patient's condition. Patient verbalized understanding and is agreeable to treatment plan. All questions answered to patient's satisfaction.    Thersia Schuyler Stark, [place]

## 2023-12-02 NOTE — Patient Instructions (Signed)

## 2023-12-08 ENCOUNTER — Encounter: Payer: Self-pay | Admitting: Radiology
